# Patient Record
Sex: Female | Born: 1980 | Race: Black or African American | Hispanic: No | Marital: Married | Smoking: Never smoker
Health system: Southern US, Community
[De-identification: ages and names within clinical notes are randomized; demographics above are authoritative.]

## PROBLEM LIST (undated history)

## (undated) HISTORY — PX: TUBAL LIGATION: SHX77

## (undated) HISTORY — PX: SALPINGOOPHORECTOMY: SHX82

---

## 2005-03-10 ENCOUNTER — Emergency Department: Payer: Self-pay | Admitting: Unknown Physician Specialty

## 2005-03-11 ENCOUNTER — Ambulatory Visit: Payer: Self-pay | Admitting: Emergency Medicine

## 2005-03-13 ENCOUNTER — Emergency Department: Payer: Self-pay | Admitting: Emergency Medicine

## 2005-03-14 ENCOUNTER — Inpatient Hospital Stay: Payer: Self-pay | Admitting: Unknown Physician Specialty

## 2006-12-25 IMAGING — US US PELV - US TRANSVAGINAL
1 series · 17 of 25 positions shown · non-contrast
Comparison: none

REASON FOR EXAM: LLQ Pain
COMMENTS:

[Series 1: us pelv - us transvaginal · 17 of 55 slices shown]
[im 1/55]
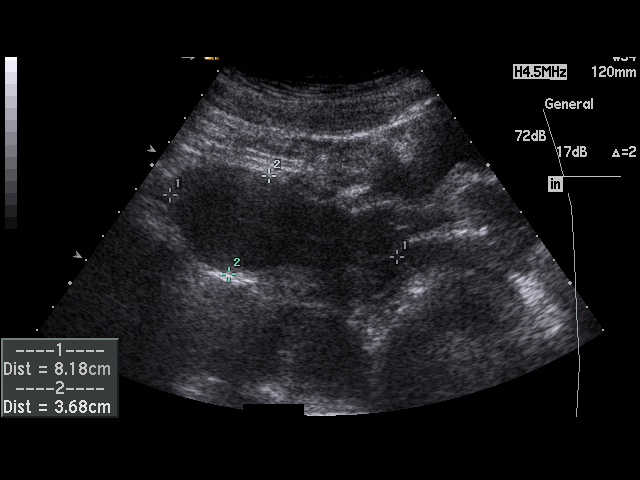
[im 5/55]
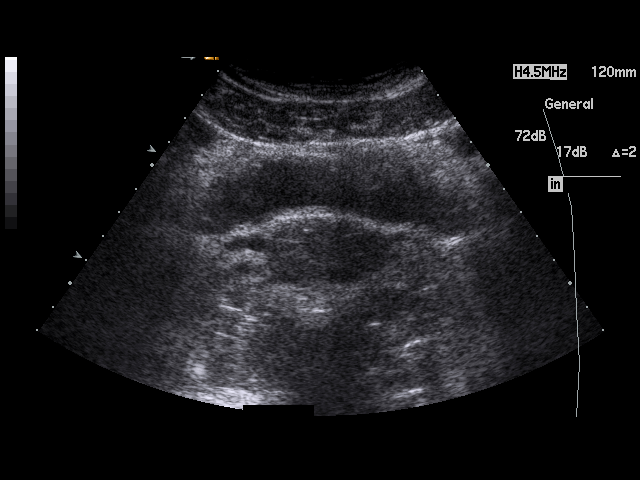
[im 7/55]
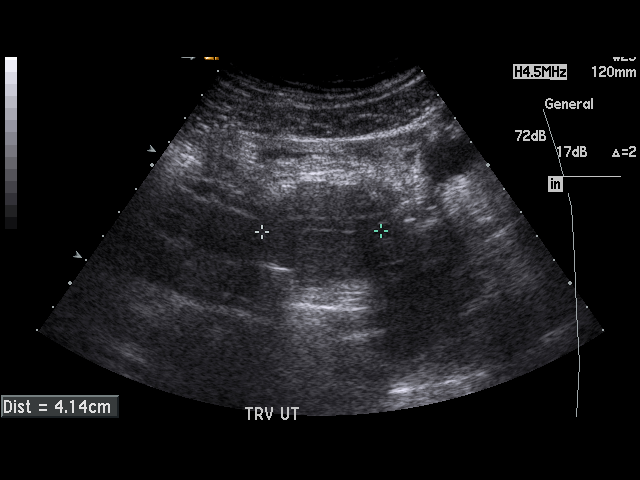
[im 12/55]
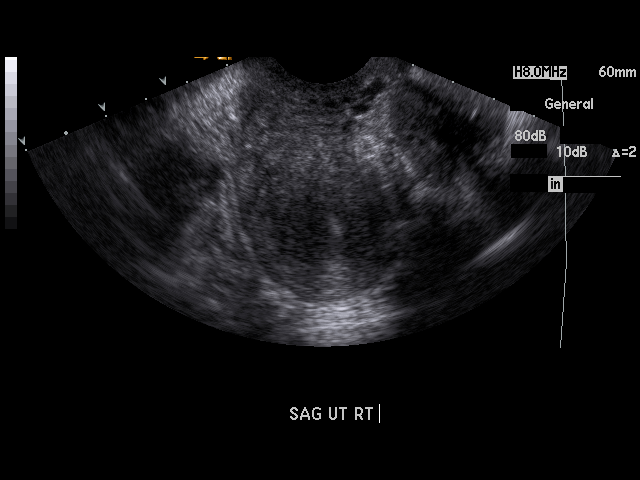
[im 14/55]
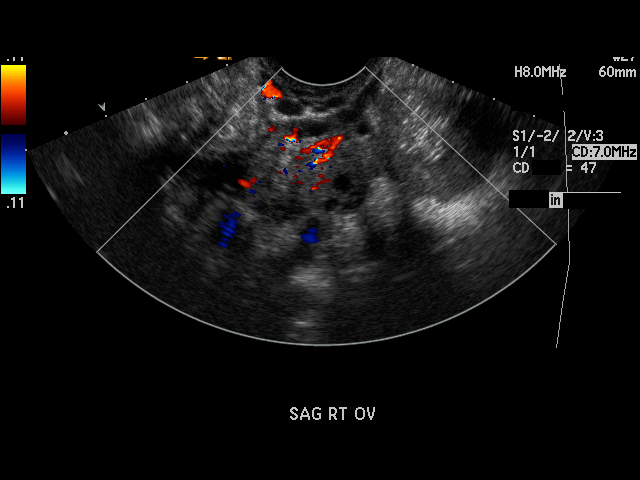
[im 19/55]
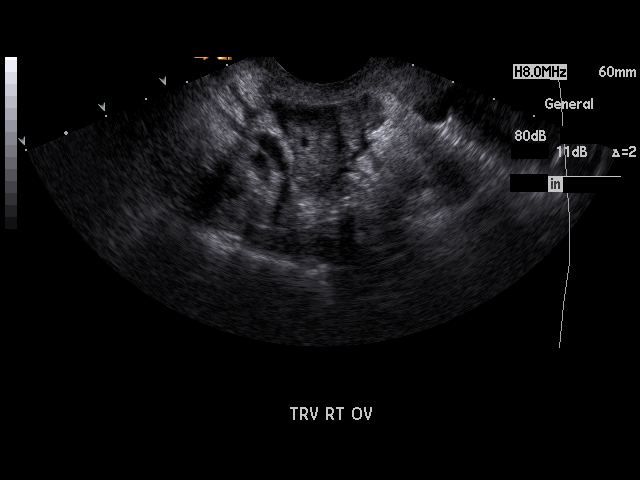
[im 21/55]
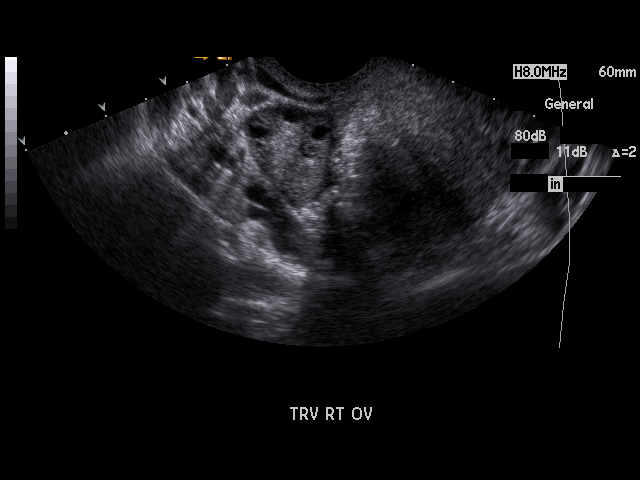
[im 25/55]
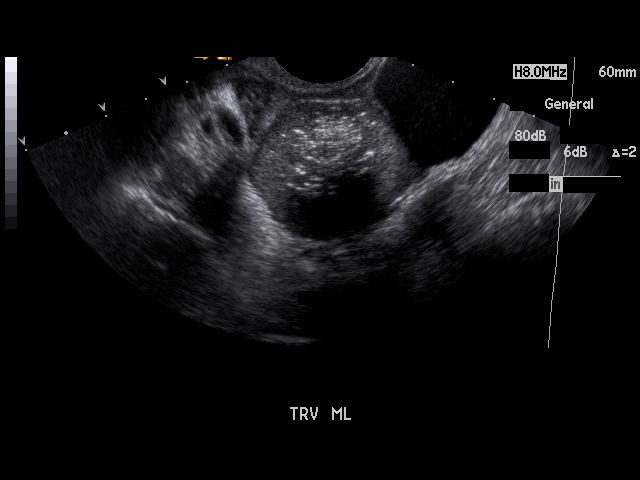
[im 28/55]
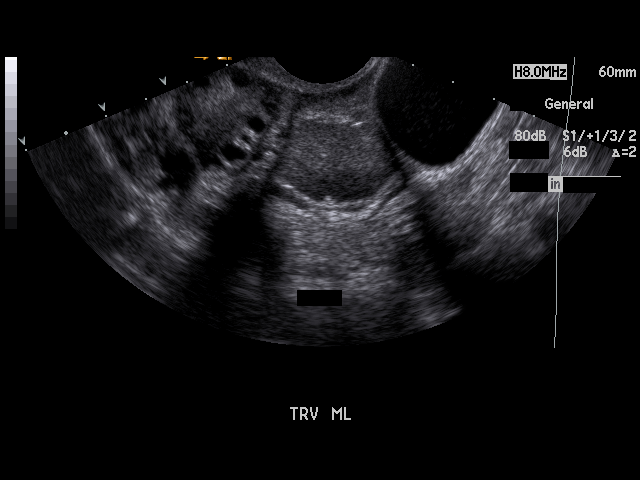
[im 30/55]
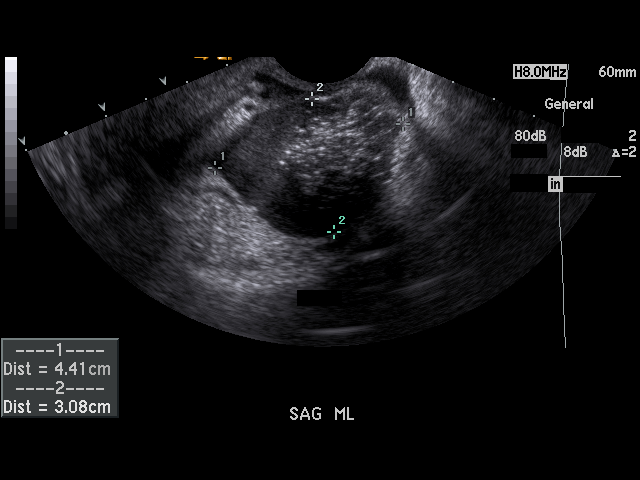
[im 34/55]
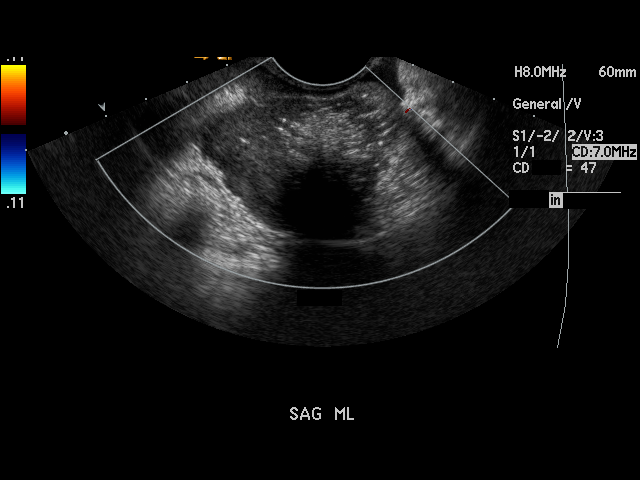
[im 37/55]
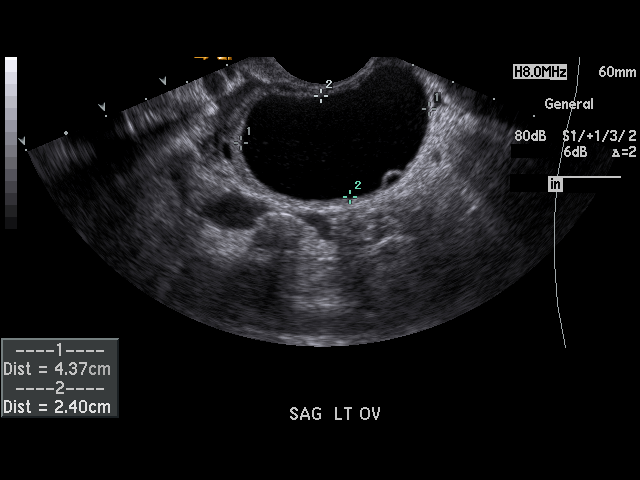
[im 41/55]
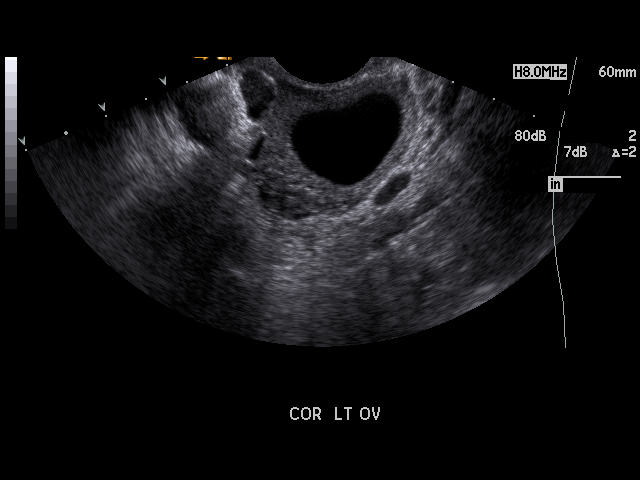
[im 43/55]
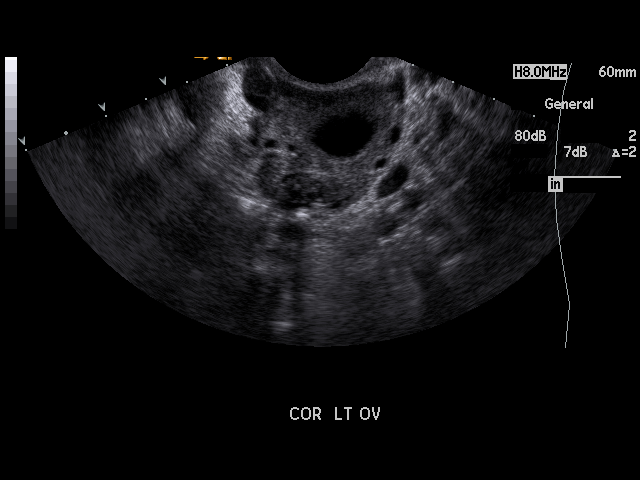
[im 48/55]
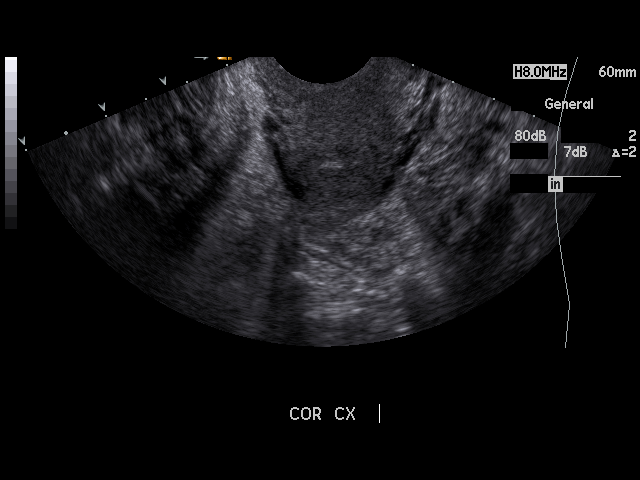
[im 50/55]
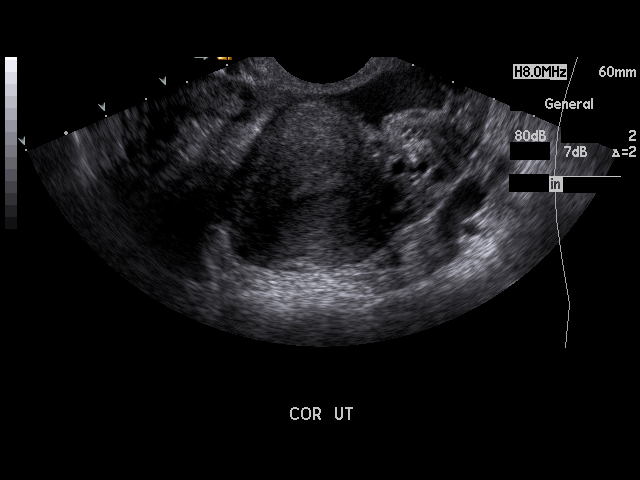
[im 55/55]
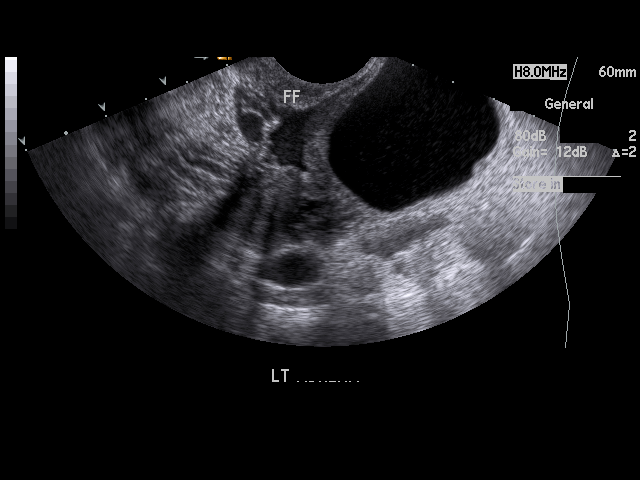

[17 of 25 positions shown; findings below may reference images not displayed]

PROCEDURE:     US  - US PELVIS MASS EXAM  - [DATE] [DATE] [DATE] [DATE]

RESULT:     Standard transvaginal and standard pelvic ultrasound was
obtained.  The uterus measures 8.2 cm x 4.1 cm x 3.7 cm.  The RIGHT ovary is
unremarkable and the maximum diameter is 3.5 cm.  The LEFT ovary has a
maximum diameter of 5 cm.  There is a large 4.4 cm cystic mass in the LEFT
ovary.  Note along the posterior wall of the uterus is a large solid mass
measuring 4.4 cm in maximum diameter.  There are internal echoes suggesting
the presence of fat.  This could represent a dermoid.  Gynecologic
consultation is suggested.  We can perform pelvic MRI for further evaluation
if clinically indicated.
IMPRESSION: Large cyst LEFT ovary.  This appears simple.

Large complex mass that appears solid and present along the posterior
uterine wall.  This mass measures approximately 5 cm and may contain fat as
well as debris.  This could represent a dermoid.  Other primary tumors
including malignancy cannot be excluded.  Gynecologic consultation is
suggested.  We can perform MRI of the pelvis for further evaluation as
clinically indicated.

## 2007-03-15 ENCOUNTER — Emergency Department: Payer: Self-pay | Admitting: Internal Medicine

## 2009-02-26 ENCOUNTER — Emergency Department: Payer: Self-pay | Admitting: Emergency Medicine

## 2010-01-09 ENCOUNTER — Encounter: Payer: Self-pay | Admitting: Maternal & Fetal Medicine

## 2010-05-21 ENCOUNTER — Observation Stay: Payer: Self-pay | Admitting: Obstetrics and Gynecology

## 2010-05-22 ENCOUNTER — Ambulatory Visit: Payer: Self-pay

## 2010-06-29 ENCOUNTER — Inpatient Hospital Stay: Payer: Self-pay | Admitting: Obstetrics and Gynecology

## 2010-08-22 ENCOUNTER — Ambulatory Visit: Payer: Self-pay | Admitting: Obstetrics and Gynecology

## 2010-08-25 ENCOUNTER — Ambulatory Visit: Payer: Self-pay | Admitting: Obstetrics and Gynecology

## 2010-12-13 IMAGING — US US PELV - US TRANSVAGINAL
1 series · 17 of 25 positions shown · non-contrast
Comparison: none

REASON FOR EXAM: right lower quadrant abdominal pain history of left
ovary cyst s/p left ovary re
COMMENTS:   LMP: One week ago

PROCEDURE:     US  - US PELVIS EXAM W/TRANSVAGINAL  - February 27, 2009  [DATE]
RESULT:
Transabdominal and endovaginal imaging of the pelvis was obtained.

[Series 1: us pelv - us transvaginal · 17 of 47 slices shown]
[im 1/47]
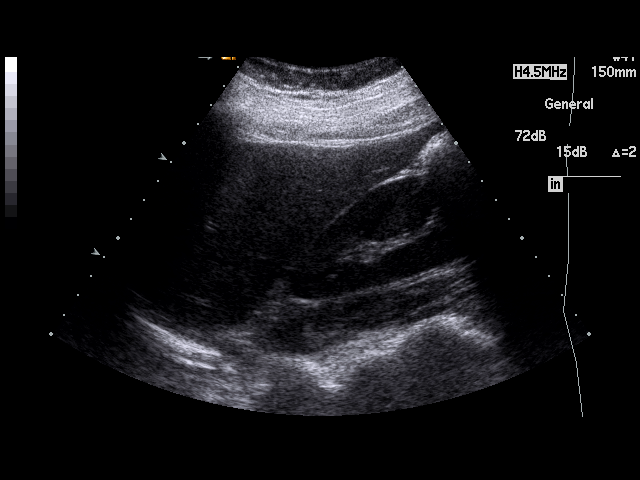
[im 4/47]
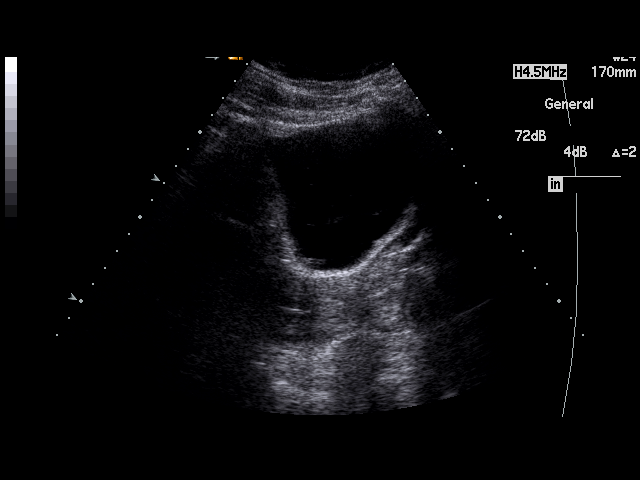
[im 6/47]
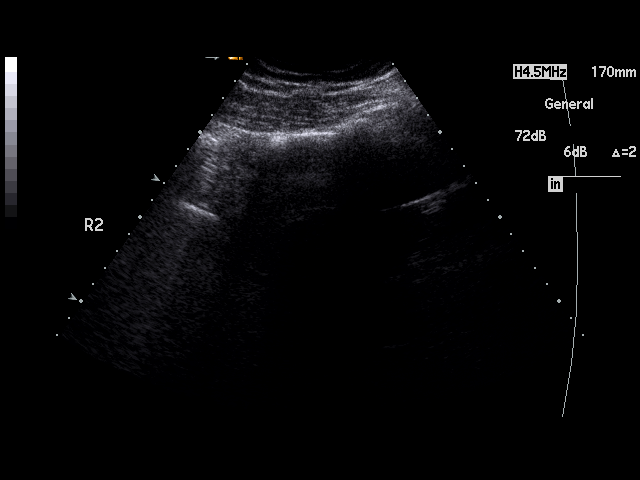
[im 10/47]
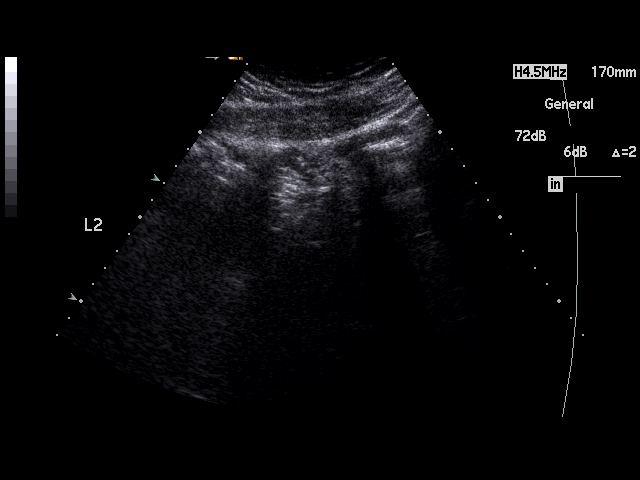
[im 12/47]
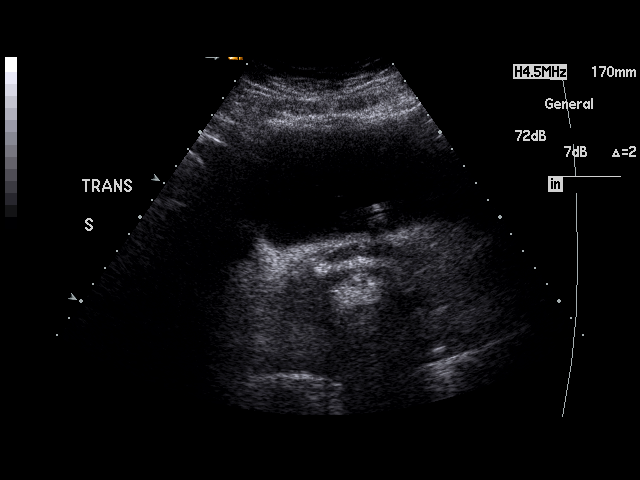
[im 16/47]
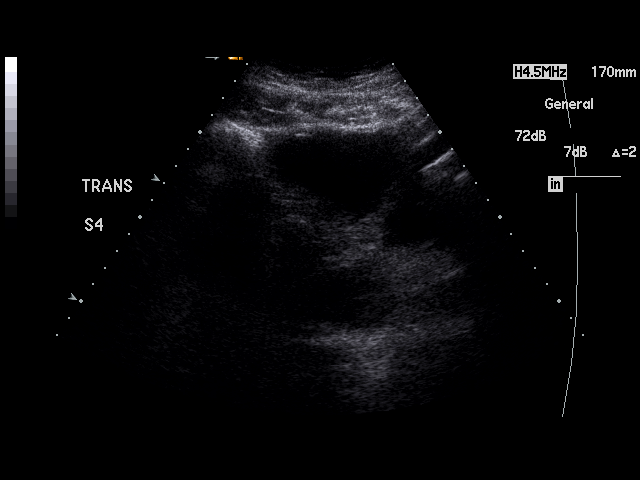
[im 18/47]
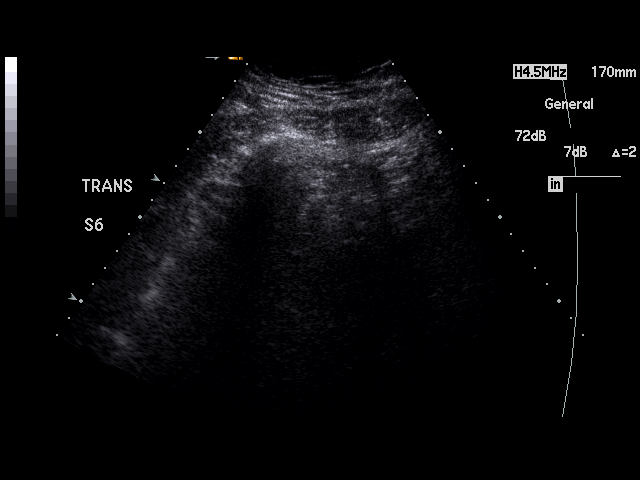
[im 22/47]
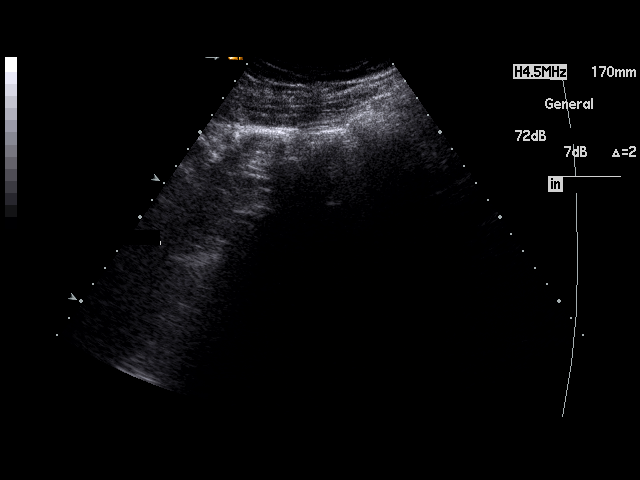
[im 24/47]
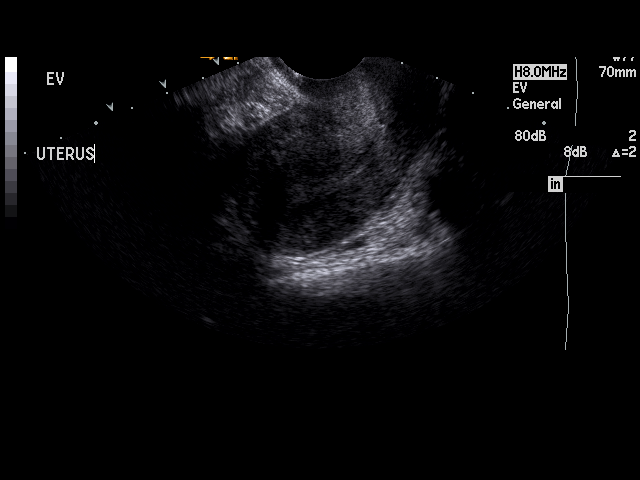
[im 25/47]
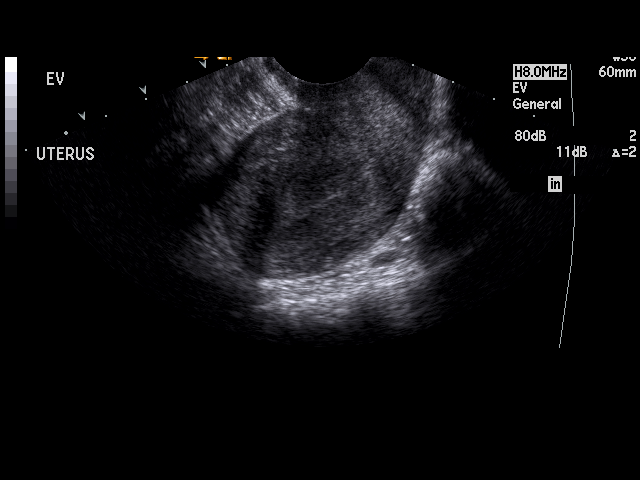
[im 29/47]
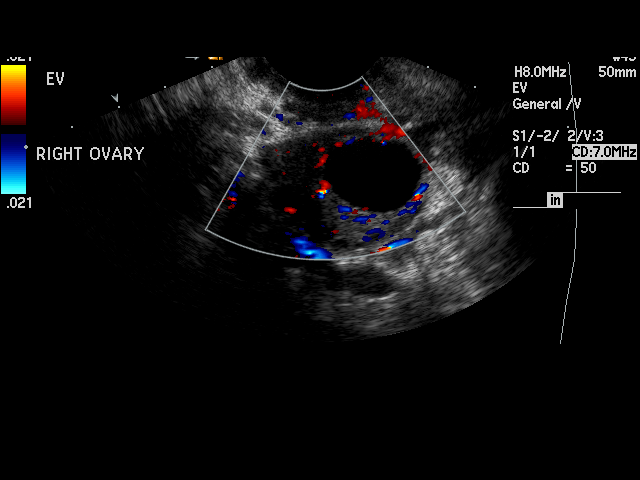
[im 31/47]
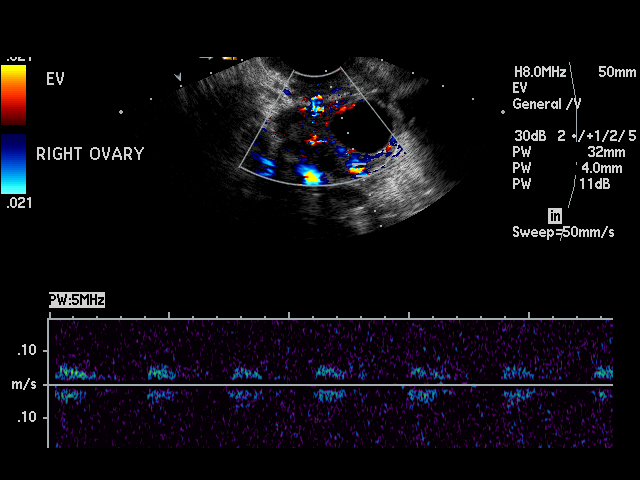
[im 35/47]
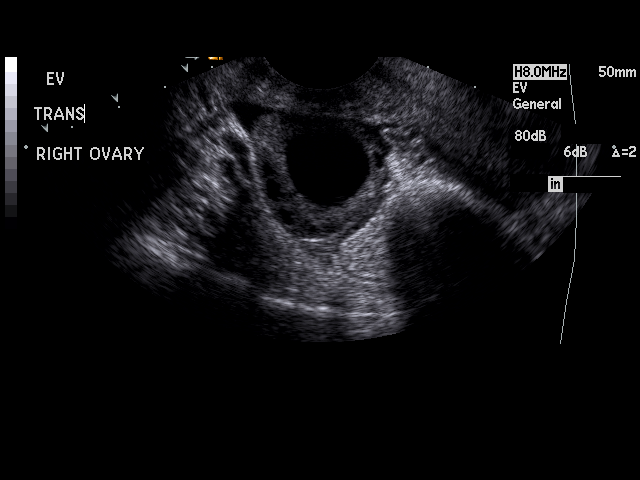
[im 37/47]
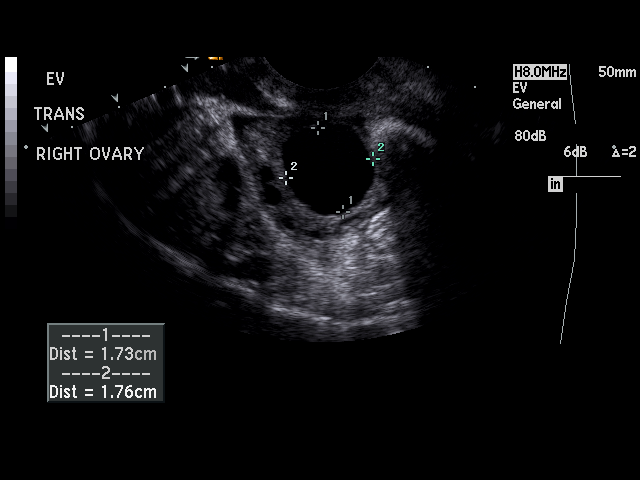
[im 41/47]
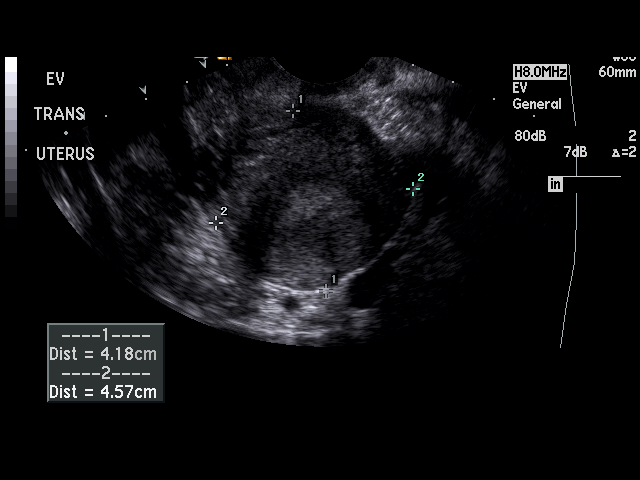
[im 43/47]
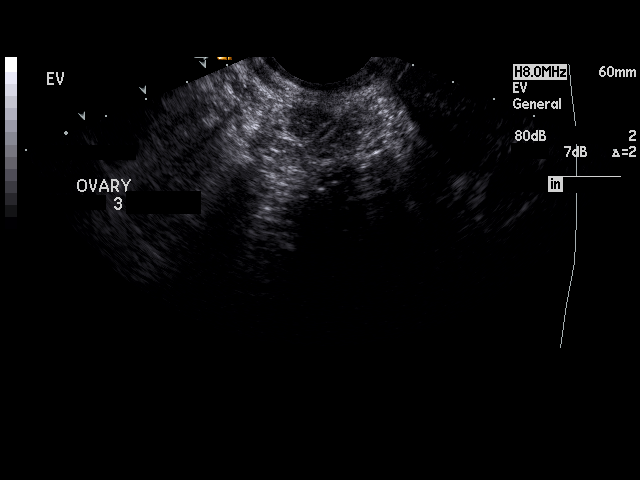
[im 47/47]
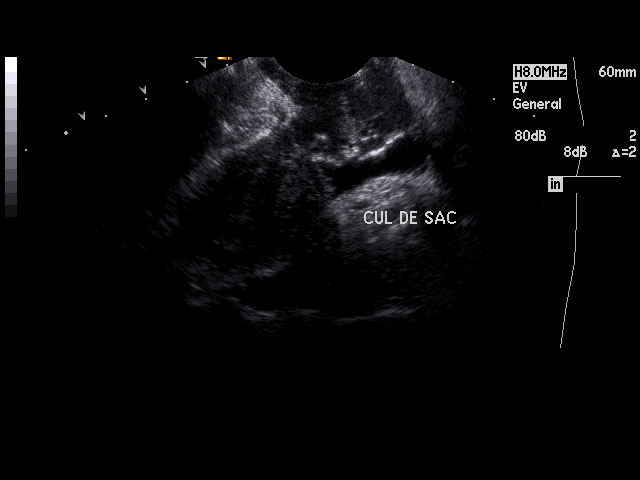

[17 of 25 positions shown; findings below may reference images not displayed]

FINDINGS: The uterus measures 6.11 x 4.18 x 4.57 cm. Endometrial thickness
is 5.7 mm. The uterus demonstrates a homogenous echotexture. The left ovary
has been removed. The right ovary measures 3.02 x 2.89 x 2.59 cm. A right
ovarian cyst is identified measuring 2.0 x 1.73 x 1.76 cm. A trace amount of
fluid is appreciated in the region of the cul-de-sac. Arterial and venous
flow is identified within the right ovary. The urinary bladder is partially
distended. The kidneys are grossly unremarkable.
IMPRESSION: 1. Right ovarian cyst.
2. Trace amount of fluid in the cul-de-sac. This may represent physiologic
fluid.
3. No further sonographic abnormalities.

Dr. Mosha of the Emergency Department was informed of these findings via a
preliminary faxed report on 02/27/2009 at [DATE] a.m.

## 2011-10-25 IMAGING — US US OB NUCHAL TRANSLUCENCY 1ST GEST - [facility] NRPT
1 series · 14 of 28 positions shown · non-contrast
Comparison: none

[Series 1: us ob nuchal translucency 1st gest - [facility] nrpt · 14 of 55 slices shown]
[im 3/55]
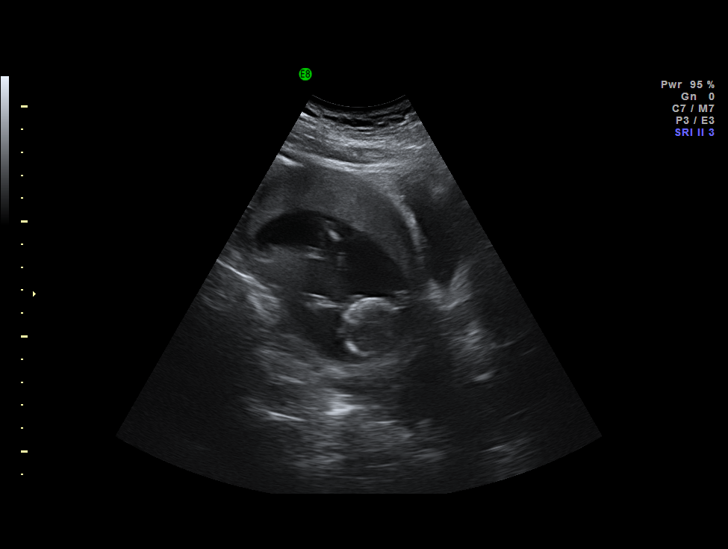
[im 7/55]
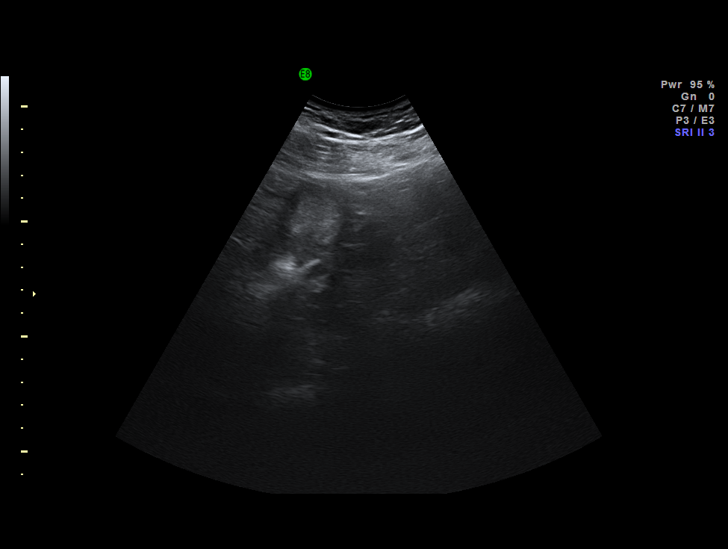
[im 11/55]
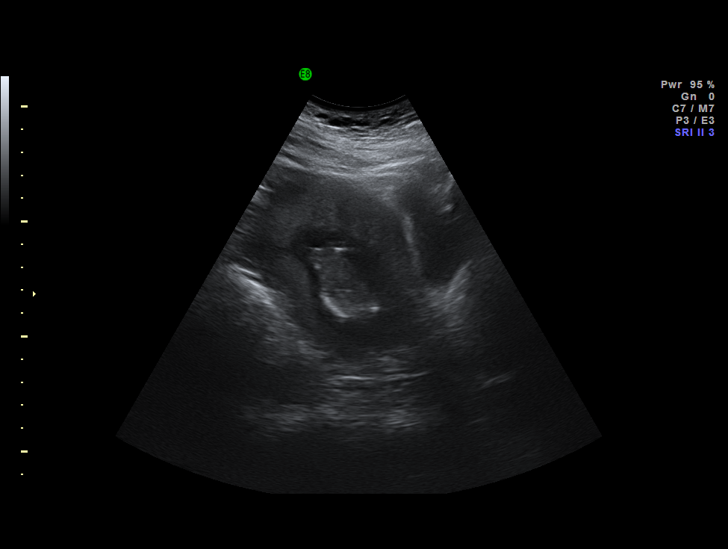
[im 15/55]
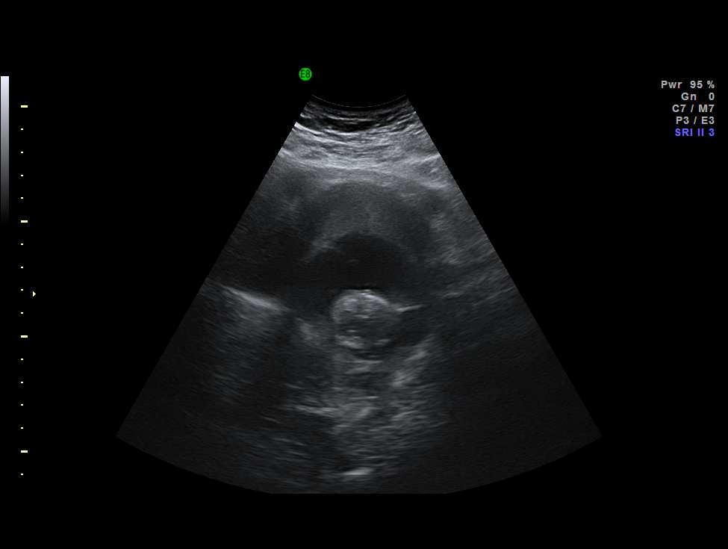
[im 19/55]
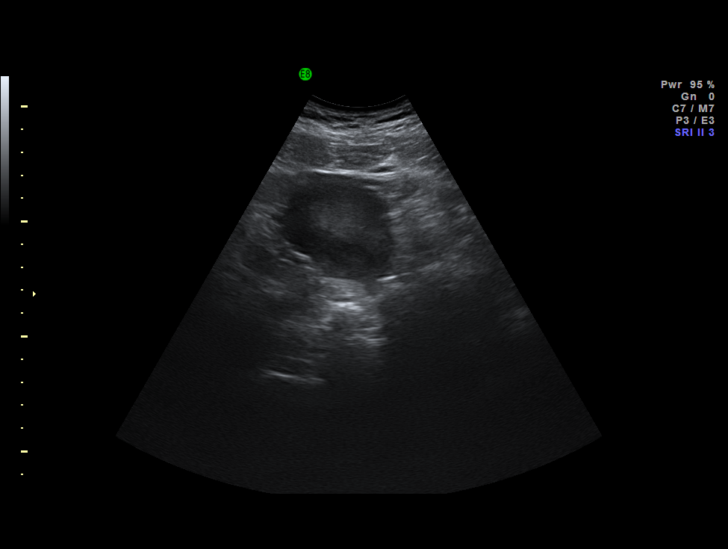
[im 23/55]
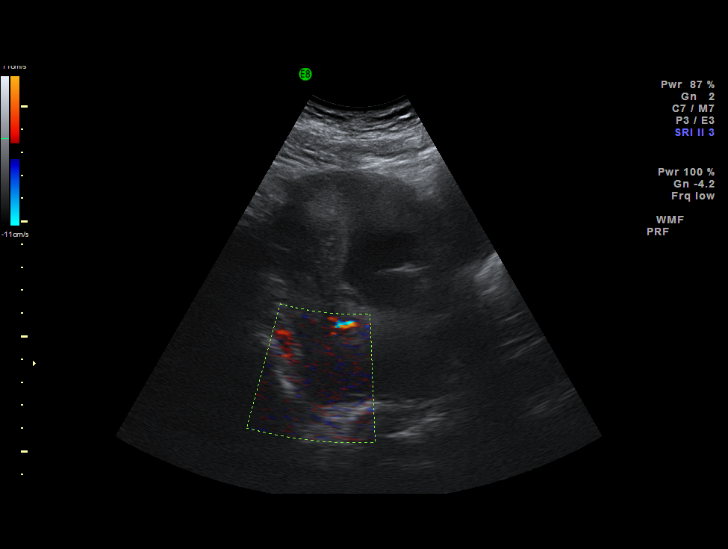
[im 27/55]
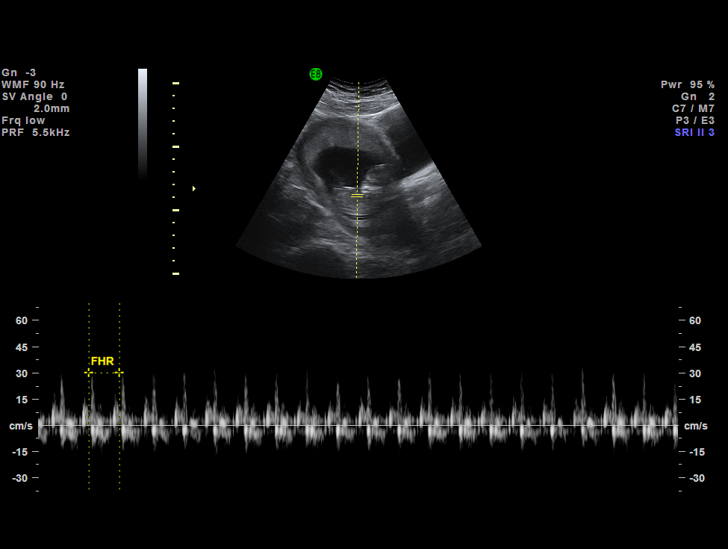
[im 31/55]
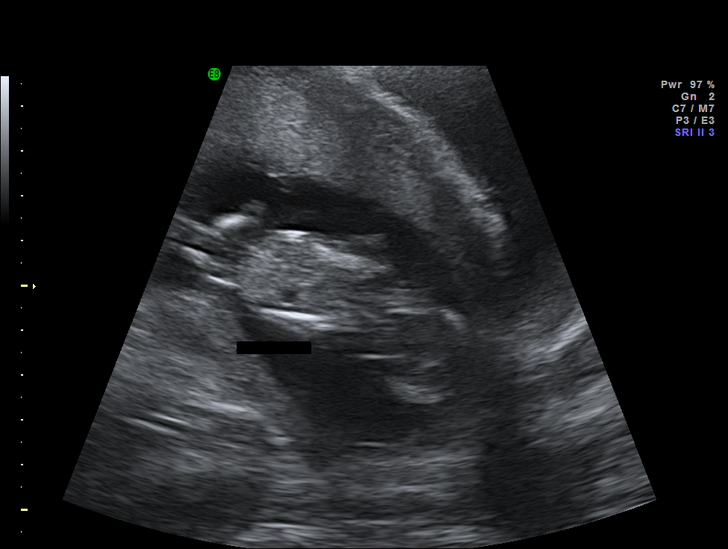
[im 35/55]
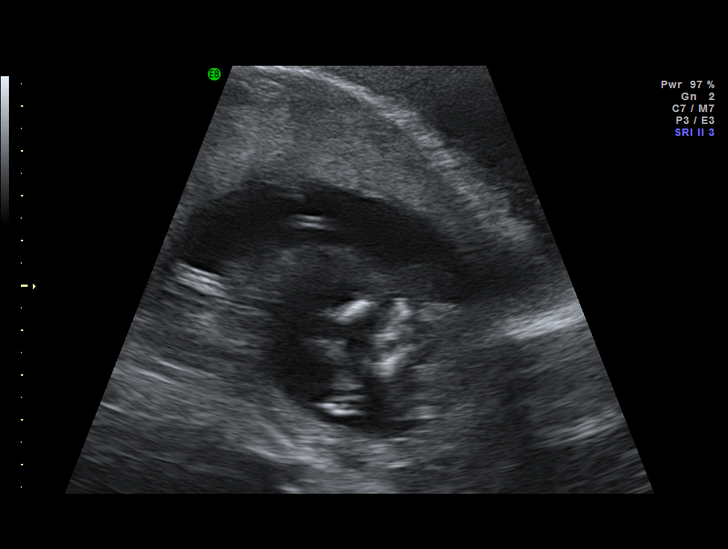
[im 39/55]
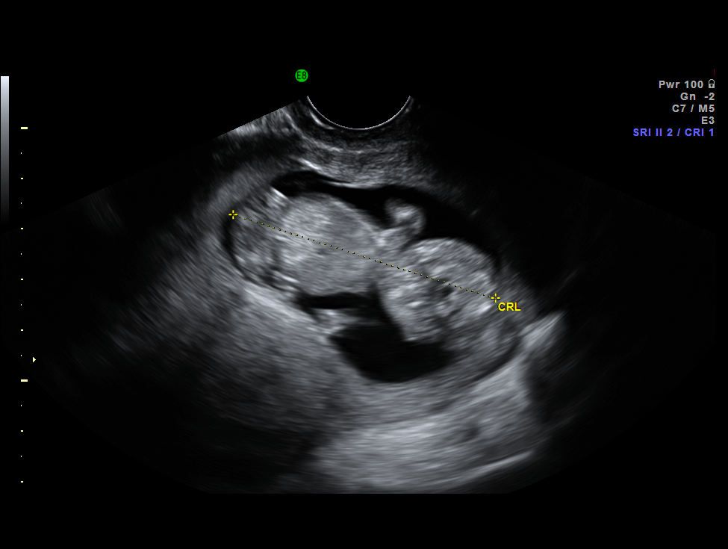
[im 43/55]
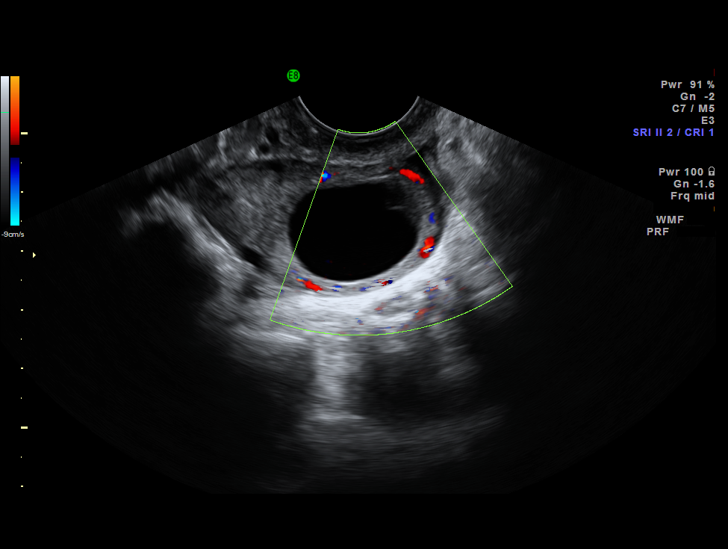
[im 47/55]
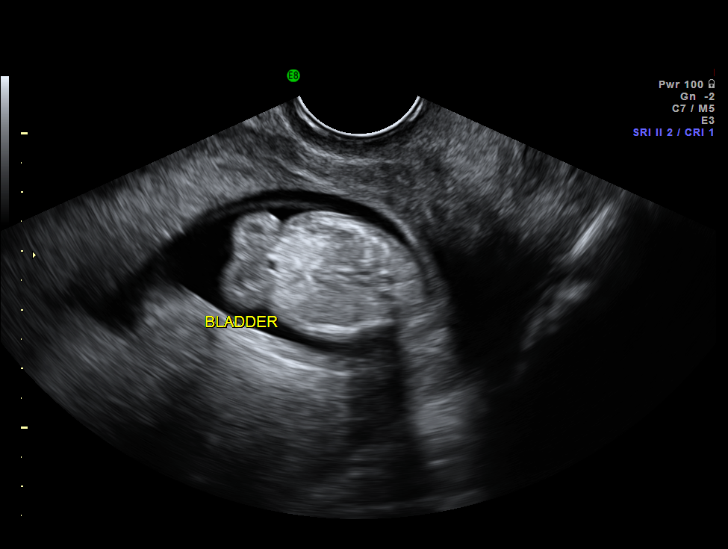
[im 51/55]
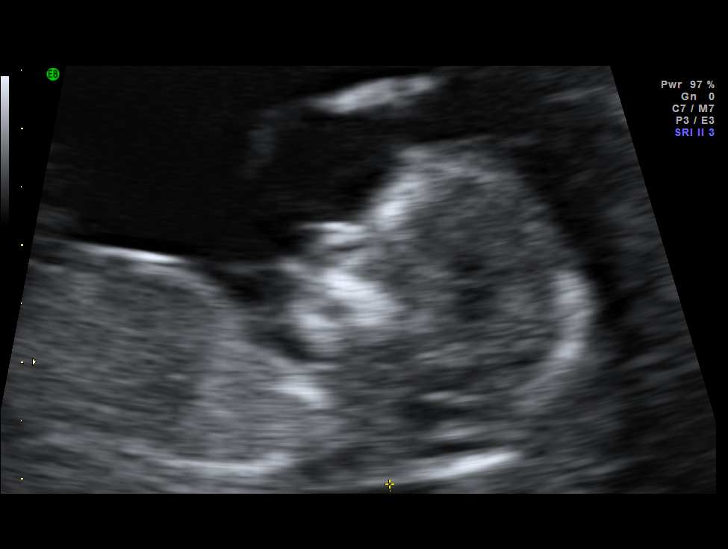
[im 55/55]
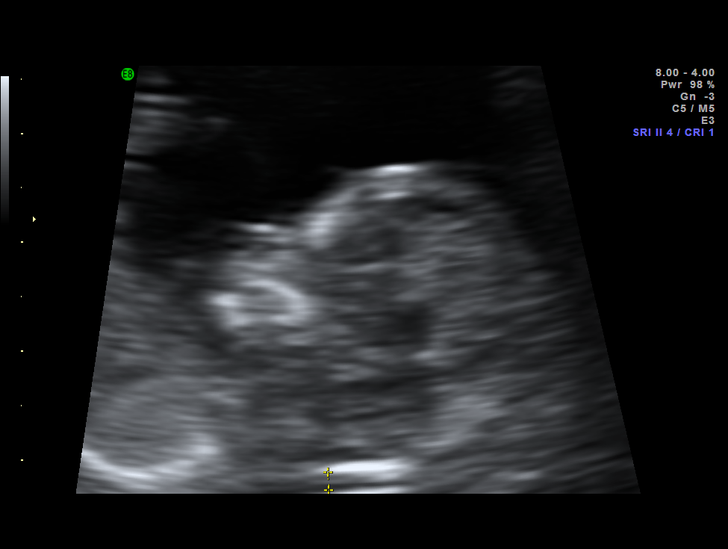

[14 of 28 positions shown; findings below may reference images not displayed]

IMAGES IMPORTED FROM THE [employer] WORKFLOW SYSTEM
NO DICTATION FOR STUDY

## 2020-07-19 ENCOUNTER — Other Ambulatory Visit: Payer: Self-pay | Admitting: Otolaryngology

## 2020-07-19 DIAGNOSIS — D481 Neoplasm of uncertain behavior of connective and other soft tissue: Secondary | ICD-10-CM

## 2020-07-30 ENCOUNTER — Ambulatory Visit
Admission: RE | Admit: 2020-07-30 | Discharge: 2020-07-30 | Disposition: A | Discharge status: Home / Self Care | Payer: 59 | Source: Ambulatory Visit | Attending: Otolaryngology | Admitting: Otolaryngology

## 2020-07-30 ENCOUNTER — Other Ambulatory Visit: Payer: Self-pay

## 2020-07-30 DIAGNOSIS — D481 Neoplasm of uncertain behavior of connective and other soft tissue: Secondary | ICD-10-CM | POA: Diagnosis not present

## 2020-08-06 ENCOUNTER — Encounter: Payer: 59 | Admitting: Obstetrics and Gynecology

## 2020-08-21 ENCOUNTER — Encounter: Payer: Self-pay | Admitting: Obstetrics and Gynecology

## 2020-10-14 ENCOUNTER — Encounter: Payer: Self-pay | Admitting: Obstetrics and Gynecology

## 2020-10-14 ENCOUNTER — Other Ambulatory Visit: Payer: Self-pay

## 2020-10-14 ENCOUNTER — Ambulatory Visit (INDEPENDENT_AMBULATORY_CARE_PROVIDER_SITE_OTHER): Payer: 59 | Admitting: Obstetrics and Gynecology

## 2020-10-14 VITALS — BP 129/89 | HR 76 | Resp 16 | Ht 65.0 in | Wt 171.5 lb

## 2020-10-14 DIAGNOSIS — R87619 Unspecified abnormal cytological findings in specimens from cervix uteri: Secondary | ICD-10-CM

## 2020-10-14 NOTE — Progress Notes (Signed)
HPI:      [name] is a 40 y.o. G3P3 who LMP was No LMP recorded.  Subjective:   She presents today because she states that she had an abnormal Pap smear.  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  She thinks she had cryo as a teenager.  She reports that she has had several children and Pap smears since that time with no abnormality appearing.  She was told to come to an OB/GYN because of her latest abnormal Pap smear.  She is only aware that it shows HPV but does not know about cellular abnormality. We have attempted to obtain a record of this Pap smear during this visit but were unable.    Hx: The following portions of the patient's history were reviewed and updated as appropriate:             She  has no past medical history on file. She does not have a problem list on file. She  has a past surgical history that includes Tubal ligation and Salpingoophorectomy (Left). Her family history includes Hypertension in her mother. She  reports that she has never smoked. She has never used smokeless tobacco. She reports current alcohol use of about 3.0 standard drinks per week. No history on file for drug use. She currently has no medications in their medication list. She has No Known Allergies.       Review of Systems:  Review of Systems  Constitutional: Denied constitutional symptoms, night sweats, recent illness, fatigue, fever, insomnia and weight loss.  Eyes: Denied eye symptoms, eye pain, photophobia, vision change and visual disturbance.  Ears/Nose/Throat/Neck: Denied ear, nose, throat or neck symptoms, hearing loss, nasal discharge, sinus congestion and sore throat.  Cardiovascular: Denied cardiovascular symptoms, arrhythmia, chest pain/pressure, edema, exercise intolerance, orthopnea and palpitations.  Respiratory: Denied pulmonary symptoms, asthma, pleuritic pain, productive sputum, cough, dyspnea and wheezing.  Gastrointestinal: Denied, gastro-esophageal  reflux, melena, nausea and vomiting.  Genitourinary: Denied genitourinary symptoms including symptomatic vaginal discharge, pelvic relaxation issues, and urinary complaints.  Musculoskeletal: Denied musculoskeletal symptoms, stiffness, swelling, muscle weakness and myalgia.  Dermatologic: Denied dermatology symptoms, rash and scar.  Neurologic: Denied neurology symptoms, dizziness, headache, neck pain and syncope.  Psychiatric: Denied psychiatric symptoms, anxiety and depression.  Endocrine: Denied endocrine symptoms including hot flashes and night sweats.   Meds:   No current outpatient medications on file prior to visit.   No current facility-administered medications on file prior to visit.      Objective:     Vitals:   10/14/20 1042  BP: 129/89  Pulse: 76  Resp: 16   Filed Weights   10/14/20 1042  Weight: 171 lb 8 oz (77.8 kg)                Assessment:    G3P3 There are no problems to display for this patient.    1. Abnormal cervical Papanicolaou smear, unspecified abnormal pap finding     Patient has known HPV for many years.  Not sure of her current Pap smear findings.   Plan:            1.  I discussed the natural course and history of HPV and Pap smear abnormalities.  The differences between HPV positivity and cellular abnormalities was discussed in detail.  Cervical dysplasia and atypical cells were also discussed.  I have answered all of her questions. She will contact her PCP and obtain the  Pap smear in question.  She will sign for old records and get [place] a copy of this Pap and we can decide further management at that time. Patient declined Pap smear today as she is on her menstrual period and does not want to be examined. Orders No orders of the defined types were placed in this encounter.   No orders of the defined types were placed in this encounter.     F/U  Return in about 2 weeks (around 10/28/2020). I spent 17 minutes involved in the care of  this patient preparing to see the patient by obtaining and reviewing her medical history (including labs, imaging tests and prior procedures), documenting clinical information in the electronic health record (EHR), counseling and coordinating care plans, writing and sending prescriptions, ordering tests or procedures and in direct communicating with the patient and medical staff discussing pertinent items from her history and physical exam.  Finis Bud, M.D. 10/14/2020 11:19 AM

## 2021-11-29 ENCOUNTER — Emergency Department
Admission: EM | Admit: 2021-11-29 | Discharge: 2021-11-29 | Disposition: A | Discharge status: Home / Self Care | Payer: 59 | Attending: Emergency Medicine | Admitting: Emergency Medicine

## 2021-11-29 ENCOUNTER — Emergency Department: Payer: 59

## 2021-11-29 ENCOUNTER — Other Ambulatory Visit: Payer: Self-pay

## 2021-11-29 DIAGNOSIS — D3500 Benign neoplasm of unspecified adrenal gland: Secondary | ICD-10-CM | POA: Insufficient documentation

## 2021-11-29 DIAGNOSIS — N946 Dysmenorrhea, unspecified: Secondary | ICD-10-CM | POA: Insufficient documentation

## 2021-11-29 DIAGNOSIS — N938 Other specified abnormal uterine and vaginal bleeding: Secondary | ICD-10-CM | POA: Diagnosis not present

## 2021-11-29 DIAGNOSIS — D497 Neoplasm of unspecified behavior of endocrine glands and other parts of nervous system: Secondary | ICD-10-CM

## 2021-11-29 DIAGNOSIS — D219 Benign neoplasm of connective and other soft tissue, unspecified: Secondary | ICD-10-CM

## 2021-11-29 DIAGNOSIS — D259 Leiomyoma of uterus, unspecified: Secondary | ICD-10-CM | POA: Diagnosis not present

## 2021-11-29 LAB — CHLAMYDIA/NGC RT PCR (ARMC ONLY)
Chlamydia Tr: NOT DETECTED
N gonorrhoeae: NOT DETECTED

## 2021-11-29 LAB — WET PREP, GENITAL
Clue Cells Wet Prep HPF POC: NONE SEEN
Sperm: NONE SEEN
Trich, Wet Prep: NONE SEEN
WBC, Wet Prep HPF POC: <10 (ref <10)
Yeast Wet Prep HPF POC: NONE SEEN

## 2021-11-29 LAB — CBC
HCT: 36.5 % (ref 36.0–46.0)
Hemoglobin: 12.3 g/dL (ref 12.0–15.0)
MCH: 31 pg (ref 26.0–34.0)
MCHC: 33.7 g/dL (ref 30.0–36.0)
MCV: 91.9 fL (ref 80.0–100.0)
Platelets: 370 10*3/uL (ref 150–400)
RBC: 3.97 MIL/uL (ref 3.87–5.11)
RDW: 13.4 % (ref 11.5–15.5)
WBC: 12.3 10*3/uL — ABNORMAL HIGH (ref 4.0–10.5)
nRBC: 0 % (ref 0.0–0.2)

## 2021-11-29 LAB — POC URINE PREG, ED: Preg Test, Ur: NEGATIVE

## 2021-11-29 LAB — COMPREHENSIVE METABOLIC PANEL
ALT: 28 U/L (ref 0–44)
AST: 29 U/L (ref 15–41)
Albumin: 4.2 g/dL (ref 3.5–5.0)
Alkaline Phosphatase: 59 U/L (ref 38–126)
Anion gap: 10 (ref 5–15)
BUN: 5 mg/dL — ABNORMAL LOW (ref 6–20)
CO2: 22 mmol/L (ref 22–32)
Calcium: 9.2 mg/dL (ref 8.9–10.3)
Chloride: 105 mmol/L (ref 98–111)
Creatinine, Ser: 0.67 mg/dL (ref 0.44–1.00)
GFR, Estimated: >60 mL/min (ref >60)
Glucose, Bld: 139 mg/dL — ABNORMAL HIGH (ref 70–99)
Potassium: 3.5 mmol/L (ref 3.5–5.1)
Sodium: 137 mmol/L (ref 135–145)
Total Bilirubin: 0.7 mg/dL (ref 0.3–1.2)
Total Protein: 7.9 g/dL (ref 6.5–8.1)

## 2021-11-29 LAB — URINALYSIS, ROUTINE W REFLEX MICROSCOPIC
Bacteria, UA: NONE SEEN
Bilirubin Urine: NEGATIVE
Glucose, UA: NEGATIVE mg/dL
Ketones, ur: NEGATIVE mg/dL
Leukocytes,Ua: NEGATIVE
Nitrite: NEGATIVE
Protein, ur: NEGATIVE mg/dL
Specific Gravity, Urine: 1.01 (ref 1.005–1.030)
pH: 6 (ref 5.0–8.0)

## 2021-11-29 LAB — LIPASE, BLOOD: Lipase: 27 U/L (ref 11–51)

## 2021-11-29 MED ORDER — IOHEXOL 300 MG/ML  SOLN
100.0000 mL | Freq: Once | INTRAMUSCULAR | Status: AC | PRN
  Administered 2021-11-29: 100 mL via INTRAVENOUS

## 2021-11-29 MED ORDER — KETOROLAC TROMETHAMINE 30 MG/ML IJ SOLN
30.0000 mg | Freq: Once | INTRAMUSCULAR | Status: AC
  Administered 2021-11-29: 30 mg via INTRAVENOUS
  Filled 2021-11-29: qty 1

## 2021-11-29 MED ORDER — ONDANSETRON HCL 4 MG/2ML IJ SOLN
4.0000 mg | INTRAMUSCULAR | Status: AC
  Administered 2021-11-29: 4 mg via INTRAVENOUS
  Filled 2021-11-29: qty 2

## 2021-11-29 NOTE — ED Notes (Signed)
Pt to ED with partner, pt states hx L ovary removed due to ovarian cysts, pt complains of pain to R side and R lower abdomen, similar to pain with ovarian cyst. Pt also reports is saturating 2 overnight menstrual pads per hour since yesterday. Pt ambulatory to treatment room, no dizziness reported.

## 2021-11-29 NOTE — ED Notes (Signed)
Pt to US.

## 2021-11-29 NOTE — ED Provider Notes (Signed)
[facility] Provider Note    Event Date/Time   First MD Initiated Contact with Patient 11/29/21 [phone]     (approximate)   History   Vaginal Bleeding   HPI  Susan Mathews is a 41 y.o. female who on review of previous note by Dr. Amalia Hailey OB/GYN has a history of previously abnormal Pap smear  The patient also reports that she has had a left-sided oophorectomy due to previous torsion due to a fibroid and tubal ligation  She gets her menstrual cycles fairly regularly each month, last month she noticed that her seem to be slightly heavier than typical and associated with a pretty severe pain in her right lower pelvis number of the edge of the lower abdomen.  That went away, but she reports it seems to come back again her menstrual cycle starting yesterday and now again having pain in the same area but she reports it is even more intense.  The pain is fairly constant fairly sharp.  No nausea or vomiting.  No fevers or chills.  No chest pain or trouble breathing.  Reports her menstrual cycle has been fairly heavy going through about 1 pad every 2 hours      Physical Exam   Triage Vital Signs: ED Triage Vitals  Enc Vitals Group     BP 11/29/21 0533 (!) 162/99     Pulse Rate 11/29/21 0533 78     Resp 11/29/21 0533 15     Temp 11/29/21 0533 98.4 F (36.9 C)     Temp Source 11/29/21 0533 Oral     SpO2 11/29/21 0533 97 %     Weight 11/29/21 0534 168 lb (76.2 kg)     Height 11/29/21 0534 '5\' 6"'$  (1.676 m)     Head Circumference --      Peak Flow --      Pain Score 11/29/21 0533 8     Pain Loc --      Pain Edu? --      Excl. in [place]? --     Most recent vital signs: Vitals:   11/29/21 0533 11/29/21 1052  BP: (!) 162/99 (!) 149/94  Pulse: 78 79  Resp: 15 16  Temp: 98.4 F (36.9 C)   SpO2: 97% 100%     General: Awake, no distress.  Very pleasant.  Seated in chair.  Easily walks over to the exam table. CV:  Good peripheral perfusion.  Normal heart  tones Resp:  Normal effort.  Abd:  No distention.  Reports focal tenderness primarily in the right lower quadrant, approximating the region of McBurney's point.  There is no rebound or guarding no.  She also reports tenderness of similar nature across the suprapubic region.  No left lower quadrant pain.  Negative for right upper quadrant or left upper quadrant. Other:   ----------------------------------------- 12:18 PM on 11/29/2021 -----------------------------------------  Pelvic exam performed with nurse Cleotis Nipper.  Normal external exam.  Normal internal exam without tenderness.  Scant amount of vaginal bleeding is present but no purulence.  Of note patient reports vaginal bleeding has improved significantly and her pain is completely resolved at this time   ED Results / Procedures / Treatments   Labs (all labs ordered are listed, but only abnormal results are displayed) Labs Reviewed  COMPREHENSIVE METABOLIC PANEL - Abnormal; Notable for the following components:      Result Value   Glucose, Bld 139 (*)    BUN 5 (*)    All  other components within normal limits  CBC - Abnormal; Notable for the following components:   WBC 12.3 (*)    All other components within normal limits  URINALYSIS, ROUTINE W REFLEX MICROSCOPIC - Abnormal; Notable for the following components:   Color, Urine STRAW (*)    APPearance CLEAR (*)    Hgb urine dipstick MODERATE (*)    All other components within normal limits  WET PREP, GENITAL  CHLAMYDIA/NGC RT PCR ([facility] ONLY)            LIPASE, BLOOD  POC URINE PREG, ED     EKG     RADIOLOGY  CT ABDOMEN PELVIS W CONTRAST  Result Date: 11/29/2021 CLINICAL DATA:  Right lower quadrant pain. EXAM: CT ABDOMEN AND PELVIS WITH CONTRAST TECHNIQUE: Multidetector CT imaging of the abdomen and pelvis was performed using the standard protocol following bolus administration of intravenous contrast. RADIATION DOSE REDUCTION: This exam was performed according to the  departmental dose-optimization program which includes automated exposure control, adjustment of the mA and/or kV according to patient size and/or use of iterative reconstruction technique. CONTRAST:  174m OMNIPAQUE IOHEXOL 300 MG/ML  SOLN COMPARISON:  None Available. FINDINGS: Lower chest: Clear lung bases. Hepatobiliary: No focal liver abnormality is seen. No gallstones, gallbladder wall thickening, or biliary dilatation. Pancreas: Unremarkable. No pancreatic ductal dilatation or surrounding inflammatory changes. Spleen: Normal in size without focal abnormality. Adrenals/Urinary Tract: 1 cm left adrenal nodule, not fully characterized, but likely an adenoma. Normal right adrenal gland. Kidneys normal in size, orientation and position with symmetric enhancement. No renal mass, stone or hydronephrosis. Normal ureters. Normal bladder. Stomach/Bowel: Normal stomach. Small bowel and colon are normal in caliber. No wall thickening. No inflammation. Normal appendix visualized. Vascular/Lymphatic: No significant vascular findings are present. No enlarged abdominal or pelvic lymph nodes. Reproductive: Uterus and bilateral adnexa are unremarkable. Other: No abdominal wall hernia or abnormality. No abdominopelvic ascites. Musculoskeletal: No acute or significant osseous findings. IMPRESSION: 1. No acute findings within the abdomen or pelvis. Normal appendix visualized. 2. 1 cm left adrenal mass. 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 Recommend non emergent/urgent follow-up adrenal CT or MRI for further assessment. Electronically Signed   By: DLajean ManesM.D.   On: 11/29/2021 12:02   [place] COMPLETE W TRANSVAGINAL AND TORSION R/O  Result Date: 11/29/2021 CLINICAL DATA:  Right upper quadrant pain EXAM: TRANSABDOMINAL AND TRANSVAGINAL ULTRASOUND OF PELVIS DOPPLER ULTRASOUND OF OVARIES TECHNIQUE: Both transabdominal and transvaginal ultrasound examinations of the pelvis were performed. Transabdominal  technique was performed for global imaging of the pelvis including uterus, ovaries, adnexal regions, and pelvic cul-de-sac. It was necessary to proceed with endovaginal exam following the transabdominal exam to visualize the ovaries. Color and duplex Doppler ultrasound was utilized to evaluate blood flow to the ovaries. COMPARISON:  02/27/2009 FINDINGS: Uterus Measurements: 9 x 5 x 6 cm = volume: 104 mL. Roughly rounded area of heterogeneity with partial shadowing measuring 2.6 cm. Endometrium Thickness: 8 mm.  No focal abnormality visualized. Right ovary Measurements: 41 x 23 x 25 mm = volume: 12 mL. Normal appearance/no adnexal mass. Left ovary History of left oophorectomy Pulsed Doppler evaluation of the remaining right ovary demonstrates normal low-resistance arterial and venous waveforms. Other findings No abnormal free fluid. IMPRESSION: 1. No acute finding.  Normal ovarian blood flow in the right ovary. 2. Probable intramural fibroid measuring 2.6 cm. Electronically Signed   By: JJorje GuildM.D.   On: 11/29/2021 10:22  PROCEDURES:  Critical Care performed: No  Procedures   MEDICATIONS ORDERED IN ED: Medications  ketorolac (TORADOL) 30 MG/ML injection 30 mg (30 mg Intravenous Given 11/29/21 0816)  ondansetron (ZOFRAN) injection 4 mg (4 mg Intravenous Given 11/29/21 0816)  iohexol (OMNIPAQUE) 300 MG/ML solution 100 mL (100 mLs Intravenous Contrast Given 11/29/21 1142)     IMPRESSION / MDM / ASSESSMENT AND PLAN / ED COURSE  I reviewed the triage vital signs and the nursing notes.                              Differential diagnosis includes but is not limited to, abdominal perforation, aortic dissection, cholecystitis, appendicitis, diverticulitis, colitis, esophagitis/gastritis, kidney stone, pyelonephritis, urinary tract infection, aortic aneurysm. All are considered in decision and treatment plan. Based upon the patient's presentation and risk factors, as well as her previous  history of gynecologic condition including prior oophorectomy on the left, We will initiate evaluation by obtaining transvaginal ultrasound to evaluate for possible GYN causes.  She does not have evidence of severe hemorrhage.  Her hemoglobin is stable, she reports that she was having fairly heavy bleeding throughout the night, seems to have tapered off somewhat today.  She is awake alert well oriented.  If ultrasound is negative for pathology involving GYN causation, then I think would consider CT imaging and pelvic exam.  She denies any unusual vaginal discharge or bleeding no fevers no obvious infectious symptoms.     Patient's presentation is most consistent with acute complicated illness / injury requiring diagnostic workup.     Return precautions and treatment recommendations and follow-up discussed with the patient who is agreeable with the plan.     FINAL CLINICAL IMPRESSION(S) / ED DIAGNOSES   Final diagnoses:  Fibroid  Dysmenorrhea  Adrenal tumor     Rx / DC Orders   ED Discharge Orders     None        Note:  This document was prepared using Dragon voice recognition software and may include unintentional dictation errors.   Delman Kitten, MD 11/29/21 [phone]

## 2021-11-29 NOTE — ED Notes (Signed)
EDP at bedside  

## 2021-11-29 NOTE — ED Triage Notes (Signed)
Pt currently on menstrual cycle. Pain is significantly worse than normal. Pt has had history of ovarian cysts and has had one tube/ovary removed. Pt reports increased bleeding, going through 2 pads/her x24hrs.

## 2021-11-29 NOTE — Discharge Instructions (Addendum)
Please follow up closely with obstetrics and gynecology or your primary doctor.  Also, please make arrangements to follow-up with primary care or obstetrics for the MRI to evaluate your adrenal gland and make sure you do not have a concerning tumor on it.  Most likely it is noncancerous, but further work-up such as MRI is advised to be certain  Return to the emergency room if your bleeding worsens, you become weak and dizzy or lightheaded, you have an episode of passing out, develop severe bleeding such as more than 1 soaked pad per hour for more than 3 straight hours, develop abdominal or pelvic pain, fevers chills or other new concerns arise.

## 2022-04-01 ENCOUNTER — Encounter: Payer: Self-pay | Admitting: Emergency Medicine

## 2022-04-01 DIAGNOSIS — H7291 Unspecified perforation of tympanic membrane, right ear: Secondary | ICD-10-CM | POA: Insufficient documentation

## 2022-04-01 NOTE — ED Triage Notes (Signed)
Pt presents via POV with complaints of R ear pain that started 2 days ago. Pt states she hasn't used Q-tips or any foreign bodies to clean her hear. Pt denies muffled hearing, dizziness, fevers, chills, drainage.

## 2022-04-02 ENCOUNTER — Emergency Department
Admission: EM | Admit: 2022-04-02 | Discharge: 2022-04-02 | Disposition: A | Discharge status: Home / Self Care | Payer: Self-pay | Attending: Emergency Medicine | Admitting: Emergency Medicine

## 2022-04-02 DIAGNOSIS — H7291 Unspecified perforation of tympanic membrane, right ear: Secondary | ICD-10-CM

## 2022-04-02 DIAGNOSIS — H9201 Otalgia, right ear: Secondary | ICD-10-CM

## 2022-04-02 MED ORDER — IBUPROFEN 600 MG PO TABS: 600.0000 mg | ORAL_TABLET | Freq: Three times a day (TID) | ORAL | 0 refills | Status: AC | PRN

## 2022-04-02 MED ORDER — OFLOXACIN 0.3 % OT SOLN: 5.0000 [drp] | Freq: Every day | OTIC | 0 refills | Status: AC

## 2022-04-02 MED ORDER — IBUPROFEN 600 MG PO TABS
600.0000 mg | ORAL_TABLET | Freq: Once | ORAL | Status: AC
  Administered 2022-04-02: 600 mg via ORAL
  Filled 2022-04-02: qty 1

## 2022-04-02 MED ORDER — OXYCODONE-ACETAMINOPHEN 5-325 MG PO TABS
1.0000 | ORAL_TABLET | Freq: Once | ORAL | Status: AC
  Administered 2022-04-02: 1 via ORAL
  Filled 2022-04-02: qty 1

## 2022-04-02 MED ORDER — OXYCODONE-ACETAMINOPHEN 5-325 MG PO TABS: 1.0000 | ORAL_TABLET | ORAL | 0 refills | Status: AC | PRN

## 2022-04-02 MED ORDER — OFLOXACIN 0.3 % OP SOLN
5.0000 [drp] | Freq: Every day | OPHTHALMIC | Status: DC
  Administered 2022-04-02: 5 [drp] via OTIC
  Filled 2022-04-02: qty 5

## 2022-04-02 MED ORDER — LIDOCAINE HCL (PF) 1 % IJ SOLN
2.0000 mL | Freq: Once | INTRAMUSCULAR | Status: AC
  Administered 2022-04-02: 2 mL
  Filled 2022-04-02: qty 5

## 2022-04-02 NOTE — ED Provider Notes (Signed)
[facility] Provider Note    Event Date/Time   First MD Initiated Contact with Patient 04/02/22 [phone]     (approximate)   History   Otalgia   HPI  Susan Mathews is a 42 y.o. female who presents to the ED from home with a chief complaint of right ear pain which began 2 days ago.  Patient had congestion and cold type symptoms last week.  Reports drainage starting yesterday, today bloody.  Denies using Q-tips to clean ears or inserting foreign objects.  Denies fever/chills, muffled hearing, dizziness, nausea or vomiting.     Past Medical History  History reviewed. No pertinent past medical history.   Active Problem List  There are no problems to display for this patient.    Past Surgical History   Past Surgical History:  Procedure Laterality Date   SALPINGOOPHORECTOMY Left    TUBAL LIGATION       Home Medications   Prior to Admission medications   Medication Sig Start Date End Date Taking? Authorizing Provider  ibuprofen (ADVIL) 600 MG tablet Take 1 tablet (600 mg total) by mouth every 8 (eight) hours as needed. 04/02/22  Yes Paulette Blanch, MD  ofloxacin (FLOXIN) 0.3 % OTIC solution Place 5 drops into the right ear daily for 7 days. 04/02/22 04/09/22 Yes Paulette Blanch, MD  oxyCODONE-acetaminophen (PERCOCET/ROXICET) 5-325 MG tablet Take 1 tablet by mouth every 4 (four) hours as needed for severe pain. 04/02/22  Yes Paulette Blanch, MD     Allergies  Patient has no known allergies.   Family History   Family History  Problem Relation Age of Onset   Hypertension Mother      Physical Exam  Triage Vital Signs: ED Triage Vitals  Enc Vitals Group     BP 04/01/22 2259 (!) 136/94     Pulse Rate 04/01/22 2259 85     Resp 04/01/22 2259 18     Temp 04/01/22 2259 98.7 F (37.1 C)     Temp Source 04/01/22 2259 Oral     SpO2 04/01/22 2259 100 %     Weight 04/01/22 2300 165 lb (74.8 kg)     Height 04/01/22 2300 '5\' 6"'$  (1.676 m)     Head Circumference  --      Peak Flow --      Pain Score 04/01/22 2300 9     Pain Loc --      Pain Edu? --      Excl. in [place]? --     Updated Vital Signs: BP (!) 136/94 (BP Location: Right Arm)   Pulse 85   Temp 98.7 F (37.1 C) (Oral)   Resp 18   Ht '5\' 6"'$  (1.676 m)   Wt 74.8 kg   LMP 03/30/2022 (Exact Date)   SpO2 100%   BMI 26.63 kg/m    General: Awake, no distress.  CV:  Good peripheral perfusion.  Resp:  Normal effort.  Abd:  No distention.  Other:  Right ear: Most of TM is visible except for right upper quadrant which has bloody scab.     ED Results / Procedures / Treatments  Labs (all labs ordered are listed, but only abnormal results are displayed) Labs Reviewed - No data to display   EKG  None   RADIOLOGY None   Official radiology report(s): No results found.   PROCEDURES:  Critical Care performed: No  Procedures   MEDICATIONS ORDERED IN ED: Medications  lidocaine (PF) (  XYLOCAINE) 1 % injection 2 mL (has no administration in time range)  ofloxacin (OCUFLOX) 0.3 % ophthalmic solution 5 drop (has no administration in time range)  oxyCODONE-acetaminophen (PERCOCET/ROXICET) 5-325 MG per tablet 1 tablet (has no administration in time range)  ibuprofen (ADVIL) tablet 600 mg (has no administration in time range)     IMPRESSION / MDM / ASSESSMENT AND PLAN / ED COURSE  I reviewed the triage vital signs and the nursing notes.                             42 year old female presenting with right otalgia in the setting of recent congestion type symptoms.  Suspect small perforation resulting in bloody drainage.  Will administer lidocaine now, start ofloxacin, analgesics and patient will follow-up with ENT.  Strict return precautions given.  Patient verbalizes understanding and agrees with plan of care.  Patient's presentation is most consistent with acute, uncomplicated illness.  FINAL CLINICAL IMPRESSION(S) / ED DIAGNOSES   Final diagnoses:  Otalgia of right ear   Perforation of right tympanic membrane     Rx / DC Orders   ED Discharge Orders          Ordered    ofloxacin (FLOXIN) 0.3 % OTIC solution  Daily        04/02/22 0312    ibuprofen (ADVIL) 600 MG tablet  Every 8 hours PRN        04/02/22 0314    oxyCODONE-acetaminophen (PERCOCET/ROXICET) 5-325 MG tablet  Every 4 hours PRN        04/02/22 0314             Note:  This document was prepared using Dragon voice recognition software and may include unintentional dictation errors.   Paulette Blanch, MD 04/02/22 [phone]

## 2022-04-02 NOTE — Discharge Instructions (Addendum)
Apply antibiotic eardrops 5 drops to right ear daily x 7 days.  You may take pain medicines as needed (Motrin/Percocet #15).  Return to the ER for worsening symptoms, fever, persistent vomiting or other concerns.

## 2022-04-09 ENCOUNTER — Other Ambulatory Visit: Payer: Self-pay | Admitting: Otolaryngology

## 2022-04-09 DIAGNOSIS — H938X1 Other specified disorders of right ear: Secondary | ICD-10-CM

## 2022-04-09 DIAGNOSIS — H60339 Swimmer's ear, unspecified ear: Secondary | ICD-10-CM | POA: Diagnosis not present

## 2022-04-09 DIAGNOSIS — G9608 Other cranial cerebrospinal fluid leak: Secondary | ICD-10-CM | POA: Diagnosis not present

## 2022-04-09 DIAGNOSIS — D4819 Other specified neoplasm of uncertain behavior of connective and other soft tissue: Secondary | ICD-10-CM | POA: Diagnosis not present

## 2022-04-30 ENCOUNTER — Ambulatory Visit
Admission: RE | Admit: 2022-04-30 | Discharge: 2022-04-30 | Disposition: A | Discharge status: Home / Self Care | Payer: 59 | Source: Ambulatory Visit | Attending: Otolaryngology | Admitting: Otolaryngology

## 2022-04-30 DIAGNOSIS — D2321 Other benign neoplasm of skin of right ear and external auricular canal: Secondary | ICD-10-CM | POA: Diagnosis not present

## 2022-04-30 DIAGNOSIS — H938X1 Other specified disorders of right ear: Secondary | ICD-10-CM

## 2022-04-30 MED ORDER — GADOPICLENOL 0.5 MMOL/ML IV SOLN
7.0000 mL | Freq: Once | INTRAVENOUS | Status: AC | PRN
  Administered 2022-04-30: 7 mL via INTRAVENOUS

## 2022-05-15 IMAGING — CT CT TEMPORAL BONES W/O CM
3 of 6 series · 15 of 40 positions shown, 18 images · non-contrast
Comparison: No pertinent prior exam.

CLINICAL DATA: Pulsating sound in the right ear.  Right ear tumor.

EXAM:
CT TEMPORAL BONES WITHOUT CONTRAST
TECHNIQUE: Axial and coronal plane CT imaging of the petrous temporal bones was
performed with thin-collimation image reconstruction. No intravenous
contrast was administered. Multiplanar CT image reconstructions were
also generated.

[Series 3: ax soft temperal bones 2.00 · axial · 0.29mm/px · z∈[-564,-542]mm · 2 of 34 slices shown]
[im 12/34  brain]
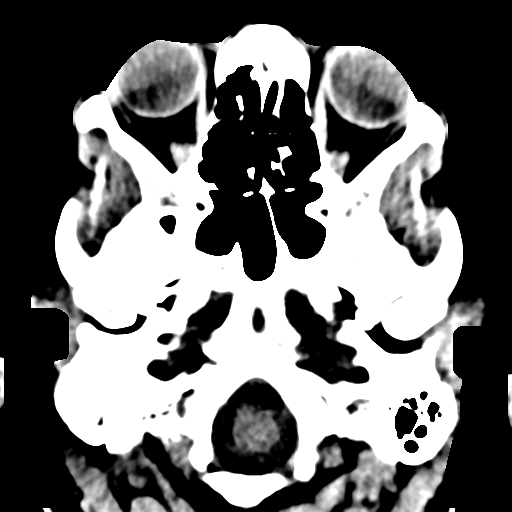
[im 23/34  brain]
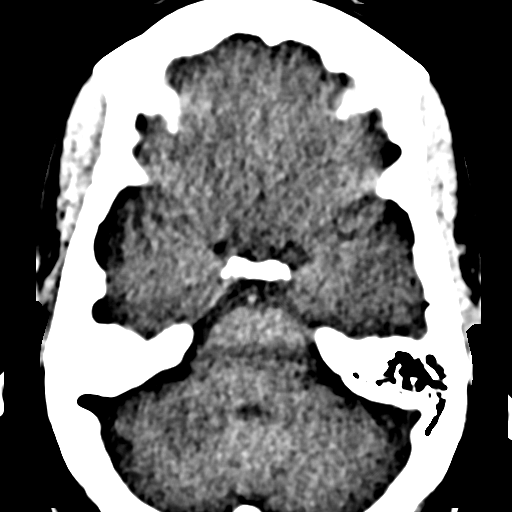

[Series 8: ax mag right temperal bones 0.60 · axial · 0.20mm/px · z∈[-581,-525]mm · 11 of 113 slices shown, 14 images]
[im 10/113  brain]
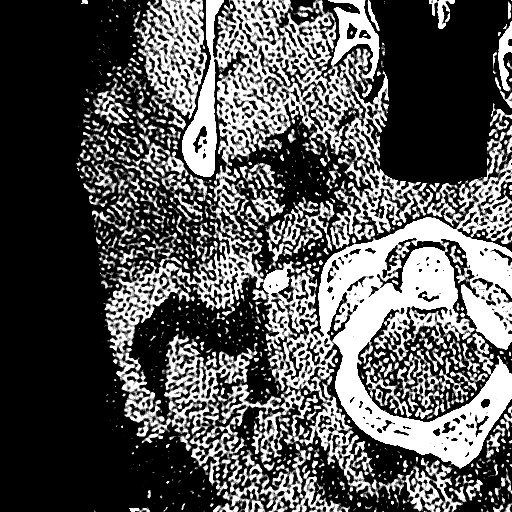
[im 10/113  bone]
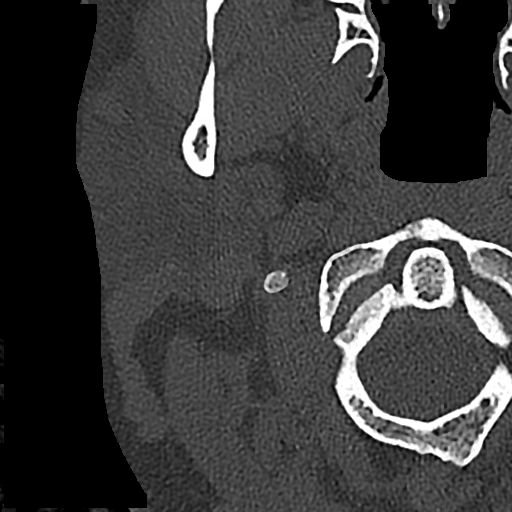
[im 19/113  bone]
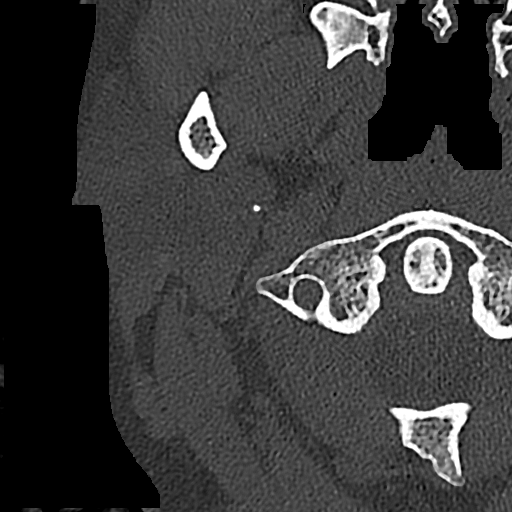
[im 29/113  bone]
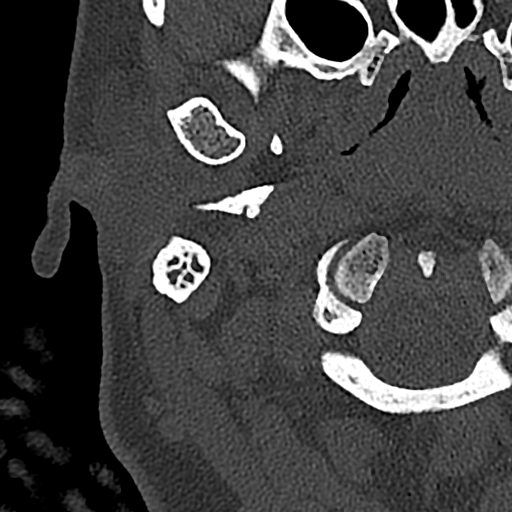
[im 38/113  bone]
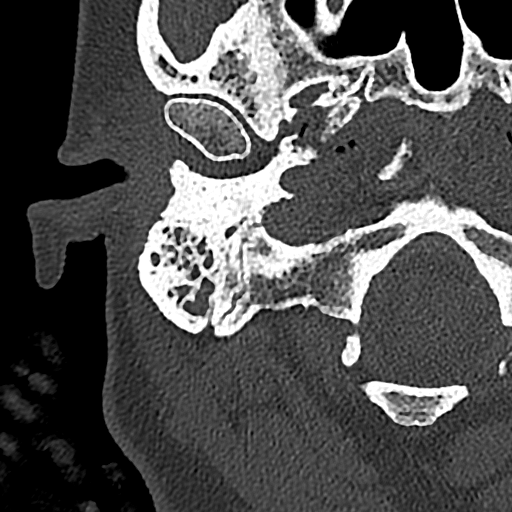
[im 47/113  brain]
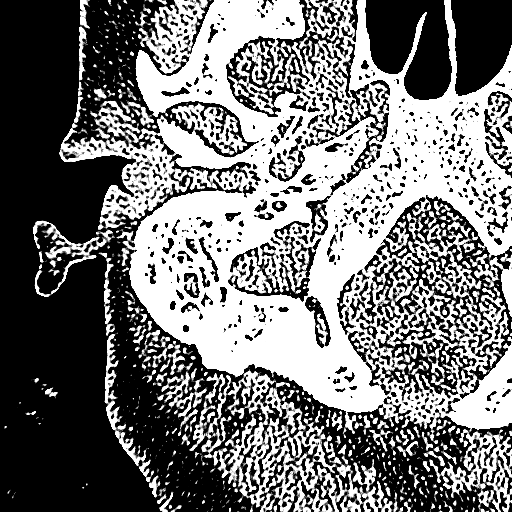
[im 47/113  bone]
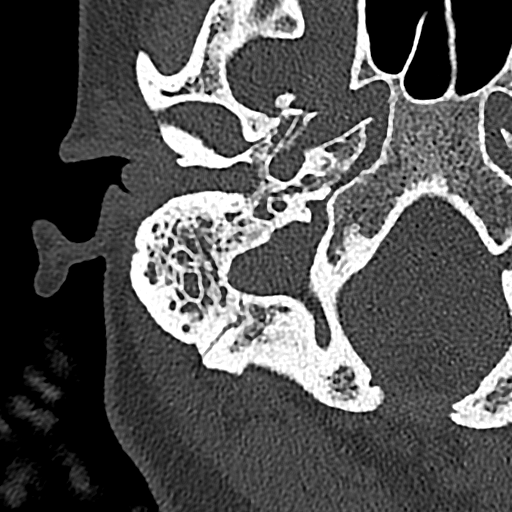
[im 57/113  bone]
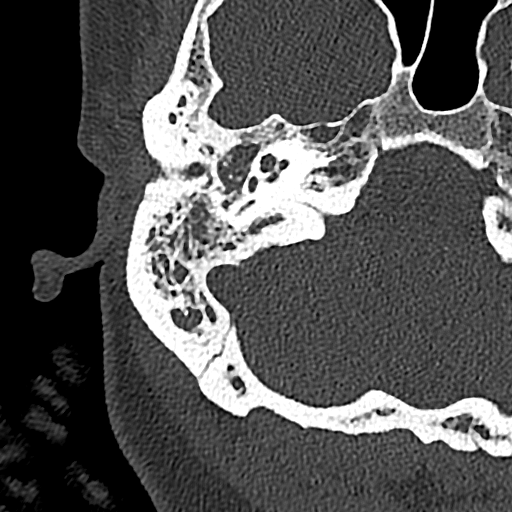
[im 66/113  bone]
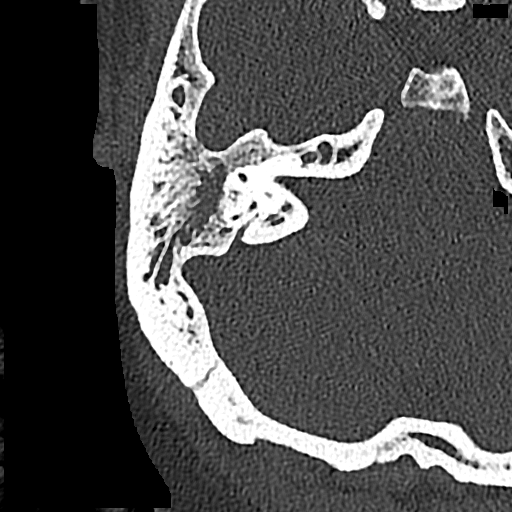
[im 75/113  bone]
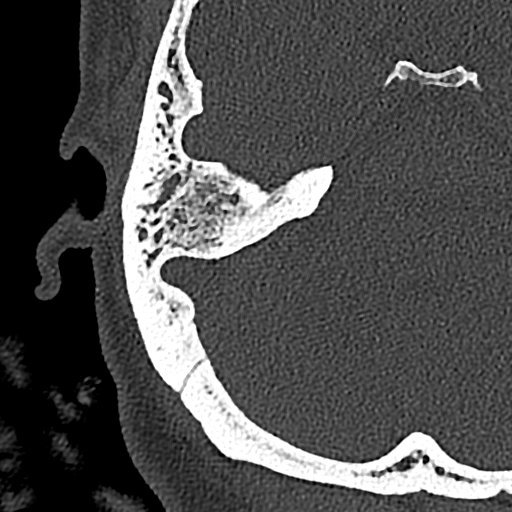
[im 85/113  brain]
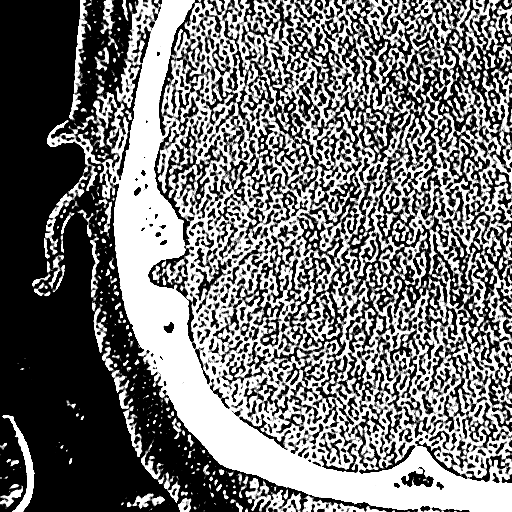
[im 85/113  bone]
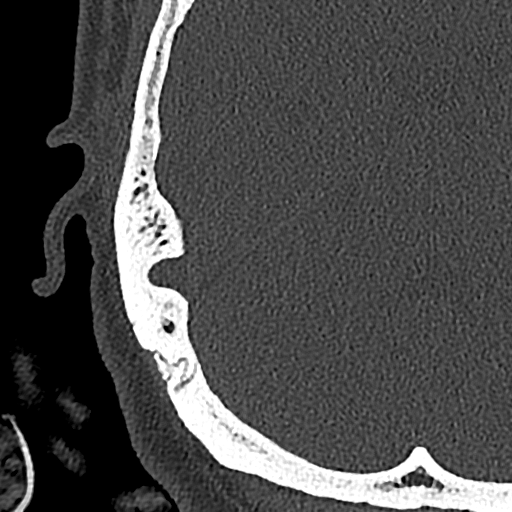
[im 94/113  bone]
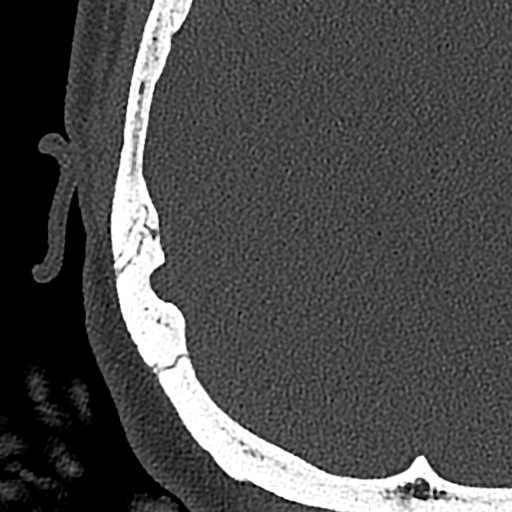
[im 103/113  bone]
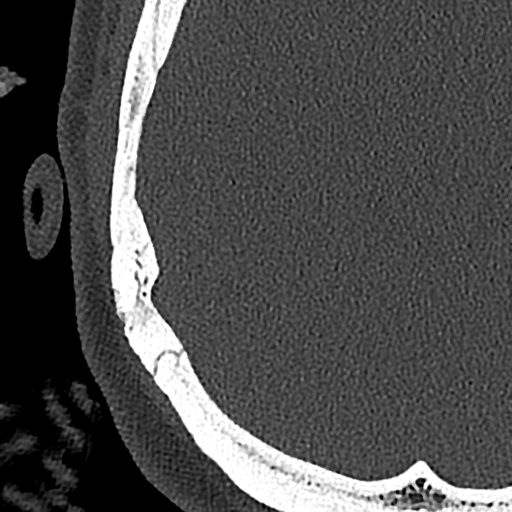

[Series 10: cor mag right temperal bones 0.80 cor · coronal · 0.13mm/px · 2 of 125 slices shown]
[im 42/125  bone]
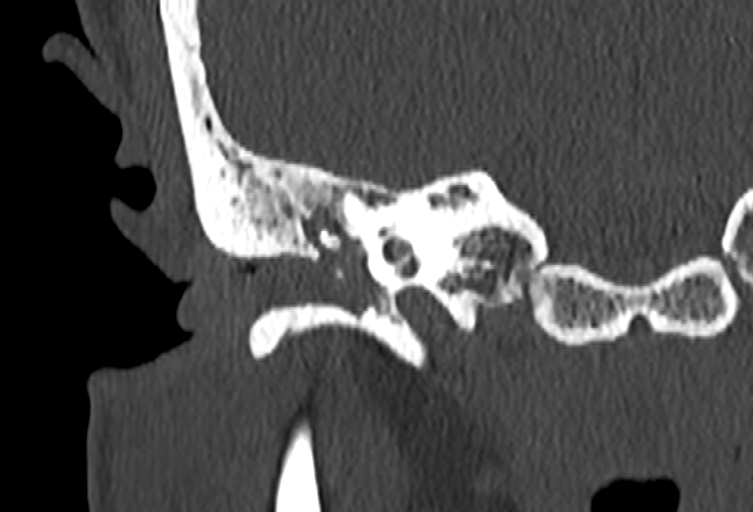
[im 83/125  bone]
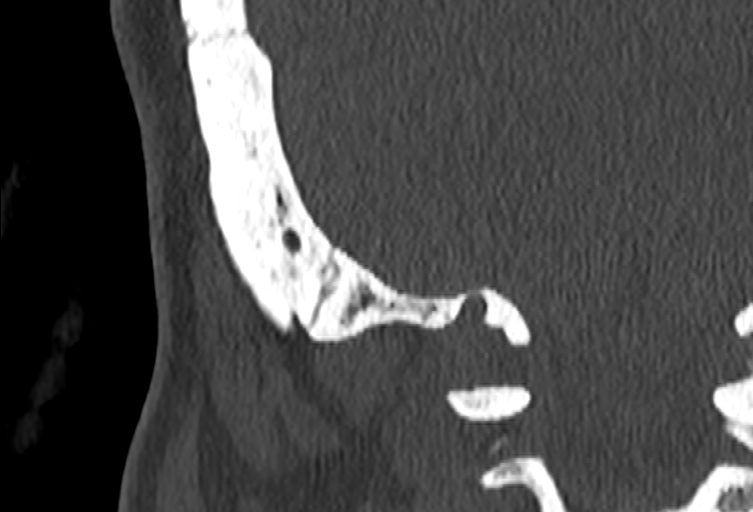

[15 of 40 positions shown; findings below may reference images not displayed]

FINDINGS: Left temporal bone: External auditory canal widely patent. Tympanic
membrane appears intact. Ossicular chain is normal. No fluid or soft
tissue abnormality in the middle ear or attic. Mastoid air cells are
clear. Inner ear structures are normal.

Right temporal bone: Complete opacification of the external auditory
canal. Tympanic membrane not identifiable because of complete
opacification on both sides of the expected location. Complete
opacification of the middle ear and attic. Ossicular chain remains
visible in grossly intact. Complete opacification throughout the
mastoid air cells without coalescence or gross bone destruction.
Inner ear structures appear intact.

Incidental note is made of complete opacification of the left
frontal sinus with chronic appearing mucoperiosteal thickening.
There are also a few opacified scattered ethmoid air cells.
IMPRESSION: Complete opacification of the right external auditory canal, middle
ear, attic and mastoid air cells. No evidence bone destruction or
erosion. Tegmen and scutum appear preserved. Inner ear structures
appear normal.

Chronic inflammatory change of the left division of the frontal
sinus. Few scattered opacified ethmoid air cells.

## 2022-05-22 DIAGNOSIS — H938X1 Other specified disorders of right ear: Secondary | ICD-10-CM | POA: Diagnosis not present

## 2022-06-03 DIAGNOSIS — H938X1 Other specified disorders of right ear: Secondary | ICD-10-CM | POA: Diagnosis not present

## 2022-06-03 DIAGNOSIS — H6691 Otitis media, unspecified, right ear: Secondary | ICD-10-CM | POA: Diagnosis not present

## 2022-06-04 DIAGNOSIS — H9391 Unspecified disorder of right ear: Secondary | ICD-10-CM | POA: Diagnosis not present

## 2022-06-04 DIAGNOSIS — H748X1 Other specified disorders of right middle ear and mastoid: Secondary | ICD-10-CM | POA: Diagnosis not present

## 2022-06-15 DIAGNOSIS — H938X1 Other specified disorders of right ear: Secondary | ICD-10-CM | POA: Diagnosis not present

## 2022-06-29 DIAGNOSIS — Z01818 Encounter for other preprocedural examination: Secondary | ICD-10-CM | POA: Diagnosis not present

## 2022-06-29 DIAGNOSIS — H938X1 Other specified disorders of right ear: Secondary | ICD-10-CM | POA: Diagnosis not present

## 2022-06-29 DIAGNOSIS — H9190 Unspecified hearing loss, unspecified ear: Secondary | ICD-10-CM | POA: Diagnosis not present

## 2022-06-29 DIAGNOSIS — H748X1 Other specified disorders of right middle ear and mastoid: Secondary | ICD-10-CM | POA: Diagnosis not present

## 2022-07-14 DIAGNOSIS — Z01818 Encounter for other preprocedural examination: Secondary | ICD-10-CM | POA: Diagnosis not present

## 2022-07-14 DIAGNOSIS — H938X1 Other specified disorders of right ear: Secondary | ICD-10-CM | POA: Diagnosis not present

## 2022-07-14 DIAGNOSIS — D509 Iron deficiency anemia, unspecified: Secondary | ICD-10-CM | POA: Diagnosis not present

## 2022-07-14 DIAGNOSIS — D1800 Hemangioma unspecified site: Secondary | ICD-10-CM | POA: Diagnosis not present

## 2022-07-20 DIAGNOSIS — Z01818 Encounter for other preprocedural examination: Secondary | ICD-10-CM | POA: Diagnosis not present

## 2022-07-20 DIAGNOSIS — H902 Conductive hearing loss, unspecified: Secondary | ICD-10-CM | POA: Diagnosis not present

## 2022-07-20 DIAGNOSIS — D447 Neoplasm of uncertain behavior of aortic body and other paraganglia: Secondary | ICD-10-CM | POA: Diagnosis not present

## 2022-07-20 DIAGNOSIS — H93A9 Pulsatile tinnitus, unspecified ear: Secondary | ICD-10-CM | POA: Diagnosis not present

## 2022-07-20 DIAGNOSIS — D385 Neoplasm of uncertain behavior of other respiratory organs: Secondary | ICD-10-CM | POA: Diagnosis not present

## 2022-07-20 DIAGNOSIS — D1800 Hemangioma unspecified site: Secondary | ICD-10-CM | POA: Diagnosis not present

## 2022-07-20 DIAGNOSIS — H938X1 Other specified disorders of right ear: Secondary | ICD-10-CM | POA: Diagnosis not present

## 2022-07-22 DIAGNOSIS — H938X1 Other specified disorders of right ear: Secondary | ICD-10-CM | POA: Diagnosis not present

## 2022-07-22 DIAGNOSIS — D385 Neoplasm of uncertain behavior of other respiratory organs: Secondary | ICD-10-CM | POA: Diagnosis not present

## 2022-08-03 DIAGNOSIS — H938X1 Other specified disorders of right ear: Secondary | ICD-10-CM | POA: Diagnosis not present

## 2022-08-06 DIAGNOSIS — R1110 Vomiting, unspecified: Secondary | ICD-10-CM | POA: Diagnosis not present

## 2022-08-06 DIAGNOSIS — R109 Unspecified abdominal pain: Secondary | ICD-10-CM | POA: Diagnosis not present

## 2022-08-08 DIAGNOSIS — E278 Other specified disorders of adrenal gland: Secondary | ICD-10-CM | POA: Diagnosis not present

## 2022-08-08 DIAGNOSIS — E279 Disorder of adrenal gland, unspecified: Secondary | ICD-10-CM | POA: Diagnosis not present

## 2022-08-08 DIAGNOSIS — N854 Malposition of uterus: Secondary | ICD-10-CM | POA: Diagnosis not present

## 2022-08-08 DIAGNOSIS — R109 Unspecified abdominal pain: Secondary | ICD-10-CM | POA: Diagnosis not present

## 2022-08-08 DIAGNOSIS — R112 Nausea with vomiting, unspecified: Secondary | ICD-10-CM | POA: Diagnosis not present

## 2022-08-10 DIAGNOSIS — N809 Endometriosis, unspecified: Secondary | ICD-10-CM | POA: Diagnosis not present

## 2022-08-10 DIAGNOSIS — N8003 Adenomyosis of the uterus: Secondary | ICD-10-CM | POA: Diagnosis not present

## 2022-08-10 DIAGNOSIS — R112 Nausea with vomiting, unspecified: Secondary | ICD-10-CM | POA: Diagnosis not present

## 2022-08-10 DIAGNOSIS — R109 Unspecified abdominal pain: Secondary | ICD-10-CM | POA: Diagnosis not present

## 2022-08-10 DIAGNOSIS — E278 Other specified disorders of adrenal gland: Secondary | ICD-10-CM | POA: Diagnosis not present

## 2022-08-10 DIAGNOSIS — R1110 Vomiting, unspecified: Secondary | ICD-10-CM | POA: Diagnosis not present

## 2022-08-10 DIAGNOSIS — R1031 Right lower quadrant pain: Secondary | ICD-10-CM | POA: Diagnosis not present

## 2022-08-11 DIAGNOSIS — N809 Endometriosis, unspecified: Secondary | ICD-10-CM | POA: Diagnosis not present

## 2022-08-11 DIAGNOSIS — N8003 Adenomyosis of the uterus: Secondary | ICD-10-CM | POA: Diagnosis not present

## 2022-08-11 DIAGNOSIS — R112 Nausea with vomiting, unspecified: Secondary | ICD-10-CM | POA: Diagnosis not present

## 2022-08-31 DIAGNOSIS — D447 Neoplasm of uncertain behavior of aortic body and other paraganglia: Secondary | ICD-10-CM | POA: Diagnosis not present

## 2022-08-31 DIAGNOSIS — Z9089 Acquired absence of other organs: Secondary | ICD-10-CM | POA: Diagnosis not present

## 2022-11-05 DIAGNOSIS — Z9089 Acquired absence of other organs: Secondary | ICD-10-CM | POA: Diagnosis not present

## 2022-12-23 DIAGNOSIS — H9071 Mixed conductive and sensorineural hearing loss, unilateral, right ear, with unrestricted hearing on the contralateral side: Secondary | ICD-10-CM | POA: Diagnosis not present

## 2022-12-25 DIAGNOSIS — D447 Neoplasm of uncertain behavior of aortic body and other paraganglia: Secondary | ICD-10-CM | POA: Diagnosis not present

## 2022-12-25 DIAGNOSIS — Z9089 Acquired absence of other organs: Secondary | ICD-10-CM | POA: Diagnosis not present

## 2022-12-25 DIAGNOSIS — H9071 Mixed conductive and sensorineural hearing loss, unilateral, right ear, with unrestricted hearing on the contralateral side: Secondary | ICD-10-CM | POA: Diagnosis not present

## 2023-01-25 DIAGNOSIS — N8003 Adenomyosis of the uterus: Secondary | ICD-10-CM | POA: Diagnosis not present

## 2023-01-25 DIAGNOSIS — Z01419 Encounter for gynecological examination (general) (routine) without abnormal findings: Secondary | ICD-10-CM | POA: Diagnosis not present

## 2023-01-25 DIAGNOSIS — Z1231 Encounter for screening mammogram for malignant neoplasm of breast: Secondary | ICD-10-CM | POA: Diagnosis not present

## 2023-01-25 DIAGNOSIS — N946 Dysmenorrhea, unspecified: Secondary | ICD-10-CM | POA: Diagnosis not present

## 2023-02-09 DIAGNOSIS — Z9089 Acquired absence of other organs: Secondary | ICD-10-CM | POA: Diagnosis not present

## 2023-02-09 DIAGNOSIS — H9071 Mixed conductive and sensorineural hearing loss, unilateral, right ear, with unrestricted hearing on the contralateral side: Secondary | ICD-10-CM | POA: Diagnosis not present
